# Patient Record
Sex: Male | Born: 1945 | Hispanic: No | Marital: Married | Smoking: Never smoker
Health system: Southern US, Community
[De-identification: ages and names within clinical notes are randomized; demographics above are authoritative.]

## PROBLEM LIST (undated history)

## (undated) DIAGNOSIS — E7800 Pure hypercholesterolemia, unspecified: Secondary | ICD-10-CM

## (undated) DIAGNOSIS — I10 Essential (primary) hypertension: Secondary | ICD-10-CM

## (undated) DIAGNOSIS — M1990 Unspecified osteoarthritis, unspecified site: Secondary | ICD-10-CM

## (undated) HISTORY — PX: COLONOSCOPY: SHX174

## (undated) HISTORY — PX: KNEE ARTHROSCOPY: SUR90

---

## 2016-09-12 ENCOUNTER — Other Ambulatory Visit: Payer: Self-pay | Admitting: Family Medicine

## 2016-09-12 DIAGNOSIS — R9389 Abnormal findings on diagnostic imaging of other specified body structures: Secondary | ICD-10-CM

## 2016-09-17 ENCOUNTER — Ambulatory Visit
Admission: RE | Admit: 2016-09-17 | Discharge: 2016-09-17 | Disposition: A | Discharge status: Home / Self Care | Payer: BLUE CROSS/BLUE SHIELD | Source: Ambulatory Visit | Attending: Family Medicine | Admitting: Family Medicine

## 2016-09-17 DIAGNOSIS — R9389 Abnormal findings on diagnostic imaging of other specified body structures: Secondary | ICD-10-CM

## 2017-10-01 HISTORY — PX: OTHER SURGICAL HISTORY: SHX169

## 2018-07-30 ENCOUNTER — Ambulatory Visit (INDEPENDENT_AMBULATORY_CARE_PROVIDER_SITE_OTHER): Payer: Self-pay

## 2018-07-30 ENCOUNTER — Encounter (INDEPENDENT_AMBULATORY_CARE_PROVIDER_SITE_OTHER): Payer: Self-pay | Admitting: Orthopaedic Surgery

## 2018-07-30 ENCOUNTER — Ambulatory Visit (INDEPENDENT_AMBULATORY_CARE_PROVIDER_SITE_OTHER): Payer: BLUE CROSS/BLUE SHIELD | Admitting: Orthopaedic Surgery

## 2018-07-30 DIAGNOSIS — G8929 Other chronic pain: Secondary | ICD-10-CM | POA: Diagnosis not present

## 2018-07-30 DIAGNOSIS — M1711 Unilateral primary osteoarthritis, right knee: Secondary | ICD-10-CM | POA: Diagnosis not present

## 2018-07-30 DIAGNOSIS — M25561 Pain in right knee: Secondary | ICD-10-CM

## 2018-07-30 NOTE — Progress Notes (Signed)
Office Visit Note   Patient: Ralph Flores           Date of Birth: 1945/10/14           MRN: [id number] Visit Date: 07/30/2018              Requested by: No referring provider defined for this encounter. PCP: No primary care provider on file.   Assessment & Plan: Visit Diagnoses:  1. Chronic pain of right knee   2. Unilateral primary osteoarthritis, right knee     Plan: The patient definitely has end-stage arthritis of his right knee.  I showed him a knee model and went over his x-rays in detail.  Using his phone we were able to face time with his daughter and explained in detail what the surgery involves.  We talked about his intraoperative and postoperative course.  Risk and benefits were discussed in detail.  He would like to have the surgery scheduled toward the end of the year.  All question concerns were answered and addressed.  We will work on getting this scheduled.  Follow-Up Instructions: Return for 2 weeks post-op.   Orders:  Orders Placed This Encounter  Procedures  . XR Knee 1-2 Views Right   No orders of the defined types were placed in this encounter.     Procedures: No procedures performed   Clinical Data: No additional findings.   Subjective: Chief Complaint  Patient presents with  . Right Knee - Pain  Patient is a very pleasant 72 year old gentleman who is been referred to me for evaluation treatment of severe arthritis involving his right knee.  He has been established patient with another group in town.  They have already performed all the conservative treatment of his knee for a long period time including aspirations, steroid injections, anti-inflammatory and gel shots.  He is at the point where this is not helped.  He has known severe end-stage arthritis of his right knee.  His daughter wanted him to come to me for knee replacement surgery.  His pain is daily and at this point is detrimentally affected his activity living, his quality of life and his  mobility.  Is been getting worse for over 3 years now.  He is otherwise someone who only has high blood pressure but is under good control.  He is not a diabetic.  HPI  Review of Systems He currently denies any headache, chest pain, shortness of breath, fever, chills, nausea, vomiting  Objective: Vital Signs: There were no vitals taken for this visit.  Physical Exam He is alert and oriented on 3 and in no acute distress Ortho Exam Examination of his right knee shows significant varus malalignment.  His range of motion is full and there is a moderate effusion.  The knee is ligamentously stable. Specialty Comments:  No specialty comments available.  Imaging: Xr Knee 1-2 Views Right  Result Date: 07/30/2018 2 views of the right knee show significant varus malalignment with complete loss of medial joint space.  There is severe patellofemoral arthritic changes as well.  There is particular osteophytes in all 3 compartments.    PMFS History: Patient Active Problem List   Diagnosis Date Noted  . Unilateral primary osteoarthritis, right knee 07/30/2018   History reviewed. No pertinent past medical history.  History reviewed. No pertinent family history.  History reviewed. No pertinent surgical history. Social History   Occupational History  . Not on file  Tobacco Use  . Smoking status: Not on  file  Substance and Sexual Activity  . Alcohol use: Not on file  . Drug use: Not on file  . Sexual activity: Not on file

## 2018-09-16 ENCOUNTER — Other Ambulatory Visit (INDEPENDENT_AMBULATORY_CARE_PROVIDER_SITE_OTHER): Payer: Self-pay | Admitting: Physician Assistant

## 2018-09-16 NOTE — Progress Notes (Signed)
EKG 11-08-2016 , STRESS TEST 11-12-16, OFFICE VISIT WITH DR. Jeanella CaraJ. GANJI, CXR 2017 ON CHART FROM [facility]

## 2018-09-16 NOTE — Patient Instructions (Addendum)
Ralph Flores  09/16/2018   Your procedure is scheduled on: 09-26-18   Report [facility] Main  Entr[facility]    Report to admitting at 7:15AM    Call this number if you have problems the morning of surgery [phone]     Remember: Do not eat food or drink liquids :After Midnight. BRUSH YOUR TEETH MORNING OF SURGERY AND RINSE YOUR MOUTH OUT, NO CHEWING GUM CANDY OR MINTS.      Take these medicines the morning of surgery with A SIP OF WATER:  Amlodipine                                  You may not have any metal on your body including hair pins and              piercings  Do not wear jewelry, make-up, lotions, powders or perfumes, deodorant                       Men may shave face and neck.   Do not bring valuables to the hospital. [facility] IS NOT             RESPONSIBLE   FOR VALUABLES.  Contacts, dentures or bridgework may not be worn into surgery.  Leave suitcase in the car. After surgery it may be brought to your room.                   Please read over the following fact sheets you were given: _____________________________________________________________________             [facility] - Preparing for Surgery Before surgery, you can play an important role.  Because skin is not sterile, your skin needs to be as free of germs as possible.  You can reduce the number of germs on your skin by washing with CHG (chlorahexidine gluconate) soap before surgery.  CHG is an antiseptic cleaner which kills germs and bonds with the skin to continue killing germs even after washing. Please DO NOT use if you have an allergy to CHG or antibacterial soaps.  If your skin becomes reddened/irritated stop using the CHG and inform your nurse when you arrive at Short Stay. Do not shave (including legs and underarms) for at least 48 hours prior to the first CHG shower.  You may shave your face/neck. Please follow these instructions carefully:  1.  Shower with CHG  Soap the night before surgery and the  morning of Surgery.  2.  If you choose to wash your hair, wash your hair first as usual with your  normal  shampoo.  3.  After you shampoo, rinse your hair and body thoroughly to remove the  shampoo.                           4.  Use CHG as you would any other liquid soap.  You can apply chg directly  to the skin and wash                       Gently with a scrungie or clean washcloth.  5.  Apply the CHG Soap to your body ONLY FROM THE NECK DOWN.   Do not use on face/ open  Wound or open sores. Avoid contact with eyes, ears mouth and genitals (private parts).                       Wash face,  Genitals (private parts) with your normal soap.             6.  Wash thoroughly, paying special attention to the area where your surgery  will be performed.  7.  Thoroughly rinse your body with warm water from the neck down.  8.  DO NOT shower/wash with your normal soap after using and rinsing off  the CHG Soap.                9.  Pat yourself dry with a clean towel.            10.  Wear clean pajamas.            11.  Place clean sheets on your bed the night of your first shower and do not  sleep with pets. Day of Surgery : Do not apply any lotions/deodorants the morning of surgery.  Please wear clean clothes to the hospital/surgery center.  FAILURE TO FOLLOW THESE INSTRUCTIONS MAY RESULT IN THE CANCELLATION OF YOUR SURGERY PATIENT SIGNATURE_________________________________  NURSE SIGNATURE__________________________________  ________________________________________________________________________   Ralph Flores  An incentive spirometer is a tool that can help keep your lungs clear and active. This tool measures how well you are filling your lungs with each breath. Taking long deep breaths may help reverse or decrease the chance of developing breathing (pulmonary) problems (especially infection) following:  A long period of time  when you are unable to move or be active. BEFORE THE PROCEDURE   If the spirometer includes an indicator to show your best effort, your nurse or respiratory therapist will set it to a desired goal.  If possible, sit up straight or lean slightly forward. Try not to slouch.  Hold the incentive spirometer in an upright position. INSTRUCTIONS FOR USE  1. Sit on the edge of your bed if possible, or sit up as far as you can in bed or on a chair. 2. Hold the incentive spirometer in an upright position. 3. Breathe out normally. 4. Place the mouthpiece in your mouth and seal your lips tightly around it. 5. Breathe in slowly and as deeply as possible, raising the piston or the ball toward the top of the column. 6. Hold your breath for 3-5 seconds or for as long as possible. Allow the piston or ball to fall to the bottom of the column. 7. Remove the mouthpiece from your mouth and breathe out normally. 8. Rest for a few seconds and repeat Steps 1 through 7 at least 10 times every 1-2 hours when you are awake. Take your time and take a few normal breaths between deep breaths. 9. The spirometer may include an indicator to show your best effort. Use the indicator as a goal to work toward during each repetition. 10. After each set of 10 deep breaths, practice coughing to be sure your lungs are clear. If you have an incision (the cut made at the time of surgery), support your incision when coughing by placing a pillow or rolled up towels firmly against it. Once you are able to get out of bed, walk around indoors and cough well. You may stop using the incentive spirometer when instructed by your caregiver.  RISKS AND COMPLICATIONS  Take your time so you do not get  dizzy or light-headed.  If you are in pain, you may need to take or ask for pain medication before doing incentive spirometry. It is harder to take a deep breath if you are having pain. AFTER USE  Rest and breathe slowly and easily.  It can be  helpful to keep track of a log of your progress. Your caregiver can provide you with a simple table to help with this. If you are using the spirometer at home, follow these instructions: [place] IF:   You are having difficultly using the spirometer.  You have trouble using the spirometer as often as instructed.  Your pain medication is not giving enough relief while using the spirometer.  You develop fever of 100.5 F (38.1 C) or higher. SEEK IMMEDIATE MEDICAL CARE IF:   You cough up bloody sputum that had not been present before.  You develop fever of 102 F (38.9 C) or greater.  You develop worsening pain at or near the incision site. MAKE SURE YOU:   Understand these instructions.  Will watch your condition.  Will get help right away if you are not doing well or get worse. Document Released: 01/28/2007 Document Revised: 12/10/2011 Document Reviewed: 03/31/2007 [facility] Patient Information 2014 [place], [place].   ________________________________________________________________________

## 2018-09-17 ENCOUNTER — Encounter (HOSPITAL_COMMUNITY): Payer: Self-pay

## 2018-09-17 ENCOUNTER — Encounter (HOSPITAL_COMMUNITY)
Admission: RE | Admit: 2018-09-17 | Discharge: 2018-09-17 | Disposition: A | Discharge status: Home / Self Care | Payer: BLUE CROSS/BLUE SHIELD | Source: Ambulatory Visit | Attending: Orthopaedic Surgery | Admitting: Orthopaedic Surgery

## 2018-09-17 ENCOUNTER — Other Ambulatory Visit: Payer: Self-pay

## 2018-09-17 DIAGNOSIS — Z01812 Encounter for preprocedural laboratory examination: Secondary | ICD-10-CM | POA: Diagnosis not present

## 2018-09-17 HISTORY — DX: Essential (primary) hypertension: I10

## 2018-09-17 HISTORY — DX: Pure hypercholesterolemia, unspecified: E78.00

## 2018-09-17 HISTORY — DX: Unspecified osteoarthritis, unspecified site: M19.90

## 2018-09-17 LAB — CBC
HCT: 42.5 % (ref 39.0–52.0)
Hemoglobin: 14 g/dL (ref 13.0–17.0)
MCH: 29.5 pg (ref 26.0–34.0)
MCHC: 32.9 g/dL (ref 30.0–36.0)
MCV: 89.7 fL (ref 80.0–100.0)
Platelets: 279 10*3/uL (ref 150–400)
RBC: 4.74 MIL/uL (ref 4.22–5.81)
RDW: 13.2 % (ref 11.5–15.5)
WBC: 9.9 10*3/uL (ref 4.0–10.5)
nRBC: 0 % (ref 0.0–0.2)

## 2018-09-17 LAB — BASIC METABOLIC PANEL
Anion gap: 6 (ref 5–15)
BUN: 16 mg/dL (ref 8–23)
CO2: 29 mmol/L (ref 22–32)
Calcium: 9.2 mg/dL (ref 8.9–10.3)
Chloride: 103 mmol/L (ref 98–111)
Creatinine, Ser: 0.83 mg/dL (ref 0.61–1.24)
GFR calc Af Amer: >60 mL/min (ref >60)
GFR calc non Af Amer: >60 mL/min (ref >60)
Glucose, Bld: 95 mg/dL (ref 70–99)
Potassium: 3.7 mmol/L (ref 3.5–5.1)
Sodium: 138 mmol/L (ref 135–145)

## 2018-09-17 LAB — SURGICAL PCR SCREEN
MRSA, PCR: NEGATIVE
Staphylococcus aureus: POSITIVE — AB

## 2018-09-18 NOTE — Progress Notes (Signed)
Final EKG dated 09-18-2018 in epic. 

## 2018-09-19 ENCOUNTER — Telehealth (INDEPENDENT_AMBULATORY_CARE_PROVIDER_SITE_OTHER): Payer: Self-pay | Admitting: Orthopaedic Surgery

## 2018-09-19 MED ORDER — MUPIROCIN 2 % EX OINT: TOPICAL_OINTMENT | CUTANEOUS | 0 refills | Status: DC

## 2018-09-19 NOTE — Telephone Encounter (Signed)
I called and spoke with patient's daughter and advised.  I gave her instructions to use per Dr. Magnus IvanBlackman.

## 2018-09-19 NOTE — Telephone Encounter (Signed)
Please advise 

## 2018-09-19 NOTE — Telephone Encounter (Signed)
It is very common to test positive for MRSA.  Short stay should have given them better directions.  He needs Mupirocin ointment.  This is to be placed in each nostril (a small q-tip amount) in each nostril daily for 5 days.

## 2018-09-19 NOTE — Telephone Encounter (Signed)
Patient's daughter called stating that the [facility] called the patient to let them know that he tested positive for MRSA and was advised to call the office to get an RX for an antibiotic cream.  CB#[phone].  Thank you.

## 2018-09-26 ENCOUNTER — Inpatient Hospital Stay (HOSPITAL_COMMUNITY): Payer: BLUE CROSS/BLUE SHIELD | Admitting: Anesthesiology

## 2018-09-26 ENCOUNTER — Inpatient Hospital Stay (HOSPITAL_COMMUNITY): Payer: BLUE CROSS/BLUE SHIELD

## 2018-09-26 ENCOUNTER — Inpatient Hospital Stay (HOSPITAL_COMMUNITY)
Admission: RE | Admit: 2018-09-26 | Discharge: 2018-09-28 | DRG: 470 | Disposition: A | Discharge status: Home Health | Payer: BLUE CROSS/BLUE SHIELD | Attending: Orthopaedic Surgery | Admitting: Orthopaedic Surgery

## 2018-09-26 ENCOUNTER — Encounter (HOSPITAL_COMMUNITY)
Admission: RE | Disposition: A | Discharge status: Home Health | Payer: Self-pay | Source: Home / Self Care | Attending: Orthopaedic Surgery

## 2018-09-26 ENCOUNTER — Encounter (HOSPITAL_COMMUNITY): Payer: Self-pay | Admitting: *Deleted

## 2018-09-26 ENCOUNTER — Other Ambulatory Visit: Payer: Self-pay

## 2018-09-26 DIAGNOSIS — D62 Acute posthemorrhagic anemia: Secondary | ICD-10-CM | POA: Diagnosis not present

## 2018-09-26 DIAGNOSIS — I952 Hypotension due to drugs: Secondary | ICD-10-CM | POA: Diagnosis not present

## 2018-09-26 DIAGNOSIS — E7800 Pure hypercholesterolemia, unspecified: Secondary | ICD-10-CM | POA: Diagnosis present

## 2018-09-26 DIAGNOSIS — M21161 Varus deformity, not elsewhere classified, right knee: Secondary | ICD-10-CM | POA: Diagnosis present

## 2018-09-26 DIAGNOSIS — T413X5A Adverse effect of local anesthetics, initial encounter: Secondary | ICD-10-CM | POA: Diagnosis not present

## 2018-09-26 DIAGNOSIS — Y92230 Patient room in hospital as the place of occurrence of the external cause: Secondary | ICD-10-CM | POA: Diagnosis not present

## 2018-09-26 DIAGNOSIS — M1711 Unilateral primary osteoarthritis, right knee: Secondary | ICD-10-CM | POA: Diagnosis present

## 2018-09-26 DIAGNOSIS — I10 Essential (primary) hypertension: Secondary | ICD-10-CM | POA: Diagnosis present

## 2018-09-26 DIAGNOSIS — Z96651 Presence of right artificial knee joint: Secondary | ICD-10-CM

## 2018-09-26 HISTORY — PX: TOTAL KNEE ARTHROPLASTY: SHX125

## 2018-09-26 SURGERY — ARTHROPLASTY, KNEE, TOTAL
Anesthesia: Monitor Anesthesia Care | Site: Knee | Laterality: Right

## 2018-09-26 MED ORDER — FENTANYL CITRATE (PF) 100 MCG/2ML IJ SOLN
50.0000 ug | INTRAMUSCULAR | Status: AC
  Administered 2018-09-26: 50 ug via INTRAVENOUS
  Filled 2018-09-26: qty 2

## 2018-09-26 MED ORDER — TELMISARTAN-HCTZ 80-25 MG PO TABS: 1.0000 | ORAL_TABLET | Freq: Every day | ORAL | Status: DC

## 2018-09-26 MED ORDER — SODIUM CHLORIDE 0.9 % IR SOLN
Status: DC | PRN
  Administered 2018-09-26: 1000 mL

## 2018-09-26 MED ORDER — ALUM & MAG HYDROXIDE-SIMETH 200-200-20 MG/5ML PO SUSP: 30.0000 mL | ORAL | Status: DC | PRN

## 2018-09-26 MED ORDER — ONDANSETRON HCL 4 MG/2ML IJ SOLN: 4.0000 mg | Freq: Four times a day (QID) | INTRAMUSCULAR | Status: DC | PRN

## 2018-09-26 MED ORDER — METHOCARBAMOL 500 MG IVPB - SIMPLE MED
INTRAVENOUS | Status: AC
  Administered 2018-09-26: 500 mg
  Filled 2018-09-26: qty 50

## 2018-09-26 MED ORDER — OXYCODONE HCL 5 MG PO TABS
5.0000 mg | ORAL_TABLET | ORAL | Status: DC | PRN
  Administered 2018-09-26 (×2): 5 mg via ORAL
  Filled 2018-09-26 (×2): qty 1

## 2018-09-26 MED ORDER — CEFAZOLIN SODIUM-DEXTROSE 1-4 GM/50ML-% IV SOLN
1.0000 g | Freq: Four times a day (QID) | INTRAVENOUS | Status: AC
  Administered 2018-09-26 (×2): 1 g via INTRAVENOUS
  Filled 2018-09-26 (×2): qty 50

## 2018-09-26 MED ORDER — ONDANSETRON HCL 4 MG/2ML IJ SOLN
INTRAMUSCULAR | Status: DC | PRN
  Administered 2018-09-26: 4 mg via INTRAVENOUS

## 2018-09-26 MED ORDER — ACETAMINOPHEN 325 MG PO TABS: 325.0000 mg | ORAL_TABLET | Freq: Four times a day (QID) | ORAL | Status: DC | PRN

## 2018-09-26 MED ORDER — PROPOFOL 10 MG/ML IV BOLUS
INTRAVENOUS | Status: AC
  Filled 2018-09-26: qty 60

## 2018-09-26 MED ORDER — CLONIDINE HCL (ANALGESIA) 100 MCG/ML EP SOLN
EPIDURAL | Status: DC | PRN
  Administered 2018-09-26: 50 ug

## 2018-09-26 MED ORDER — ACETAMINOPHEN 500 MG PO TABS
1000.0000 mg | ORAL_TABLET | Freq: Four times a day (QID) | ORAL | Status: DC
  Administered 2018-09-26 – 2018-09-28 (×7): 1000 mg via ORAL
  Filled 2018-09-26 (×8): qty 2

## 2018-09-26 MED ORDER — LACTATED RINGERS IV SOLN
INTRAVENOUS | Status: DC
  Administered 2018-09-26 (×2): via INTRAVENOUS

## 2018-09-26 MED ORDER — PANTOPRAZOLE SODIUM 40 MG PO TBEC
40.0000 mg | DELAYED_RELEASE_TABLET | Freq: Every day | ORAL | Status: DC
  Administered 2018-09-26 – 2018-09-28 (×3): 40 mg via ORAL
  Filled 2018-09-26 (×3): qty 1

## 2018-09-26 MED ORDER — OXYCODONE HCL 5 MG PO TABS
ORAL_TABLET | ORAL | Status: AC
  Administered 2018-09-26: 5 mg via ORAL
  Filled 2018-09-26: qty 1

## 2018-09-26 MED ORDER — METHOCARBAMOL 500 MG IVPB - SIMPLE MED
500.0000 mg | Freq: Four times a day (QID) | INTRAVENOUS | Status: DC | PRN
  Filled 2018-09-26: qty 50

## 2018-09-26 MED ORDER — MENTHOL 3 MG MT LOZG: 1.0000 | LOZENGE | OROMUCOSAL | Status: DC | PRN

## 2018-09-26 MED ORDER — BUPIVACAINE-EPINEPHRINE (PF) 0.5% -1:200000 IJ SOLN
INTRAMUSCULAR | Status: DC | PRN
  Administered 2018-09-26: 20 mL via PERINEURAL

## 2018-09-26 MED ORDER — ASPIRIN EC 325 MG PO TBEC
325.0000 mg | DELAYED_RELEASE_TABLET | Freq: Two times a day (BID) | ORAL | Status: DC
  Administered 2018-09-27 – 2018-09-28 (×3): 325 mg via ORAL
  Filled 2018-09-26 (×3): qty 1

## 2018-09-26 MED ORDER — METOCLOPRAMIDE HCL 5 MG/ML IJ SOLN: 5.0000 mg | Freq: Three times a day (TID) | INTRAMUSCULAR | Status: DC | PRN

## 2018-09-26 MED ORDER — PROPOFOL 10 MG/ML IV BOLUS
INTRAVENOUS | Status: AC
  Filled 2018-09-26: qty 20

## 2018-09-26 MED ORDER — KETOROLAC TROMETHAMINE 15 MG/ML IJ SOLN
7.5000 mg | Freq: Four times a day (QID) | INTRAMUSCULAR | Status: DC
  Administered 2018-09-26 – 2018-09-28 (×7): 7.5 mg via INTRAVENOUS
  Filled 2018-09-26 (×6): qty 1

## 2018-09-26 MED ORDER — PHENYLEPHRINE HCL 10 MG/ML IJ SOLN
INTRAMUSCULAR | Status: AC
  Filled 2018-09-26: qty 1

## 2018-09-26 MED ORDER — PROPOFOL 500 MG/50ML IV EMUL
INTRAVENOUS | Status: DC | PRN
  Administered 2018-09-26: 75 ug/kg/min via INTRAVENOUS

## 2018-09-26 MED ORDER — SODIUM CHLORIDE 0.9 % IV BOLUS
500.0000 mL | Freq: Once | INTRAVENOUS | Status: AC
  Administered 2018-09-26: 500 mL via INTRAVENOUS

## 2018-09-26 MED ORDER — ATORVASTATIN CALCIUM 40 MG PO TABS
40.0000 mg | ORAL_TABLET | Freq: Every day | ORAL | Status: DC
  Administered 2018-09-26: 40 mg via ORAL
  Filled 2018-09-26 (×2): qty 1

## 2018-09-26 MED ORDER — SODIUM CHLORIDE 0.9 % IV SOLN
INTRAVENOUS | Status: DC
  Administered 2018-09-26 – 2018-09-27 (×2): via INTRAVENOUS

## 2018-09-26 MED ORDER — BUPIVACAINE IN DEXTROSE 0.75-8.25 % IT SOLN
INTRATHECAL | Status: DC | PRN
  Administered 2018-09-26: 1.6 mL via INTRATHECAL

## 2018-09-26 MED ORDER — DIPHENHYDRAMINE HCL 12.5 MG/5ML PO ELIX: 12.5000 mg | ORAL_SOLUTION | ORAL | Status: DC | PRN

## 2018-09-26 MED ORDER — MIDAZOLAM HCL 2 MG/2ML IJ SOLN
1.0000 mg | INTRAMUSCULAR | Status: AC
  Administered 2018-09-26: 1 mg via INTRAVENOUS
  Filled 2018-09-26: qty 2

## 2018-09-26 MED ORDER — TAMSULOSIN HCL 0.4 MG PO CAPS
0.4000 mg | ORAL_CAPSULE | Freq: Every day | ORAL | Status: DC
  Administered 2018-09-26: 0.4 mg via ORAL
  Filled 2018-09-26: qty 1

## 2018-09-26 MED ORDER — VITAMIN D 25 MCG (1000 UNIT) PO TABS
2000.0000 [IU] | ORAL_TABLET | Freq: Every day | ORAL | Status: DC
  Administered 2018-09-27 – 2018-09-28 (×2): 2000 [IU] via ORAL
  Filled 2018-09-26 (×2): qty 2

## 2018-09-26 MED ORDER — CHLORHEXIDINE GLUCONATE 4 % EX LIQD: 60.0000 mL | Freq: Once | CUTANEOUS | Status: DC

## 2018-09-26 MED ORDER — HYDROMORPHONE HCL 1 MG/ML IJ SOLN
0.5000 mg | INTRAMUSCULAR | Status: DC | PRN
  Administered 2018-09-26: 0.5 mg via INTRAVENOUS
  Filled 2018-09-26: qty 1

## 2018-09-26 MED ORDER — STERILE WATER FOR IRRIGATION IR SOLN
Status: AC | PRN
  Administered 2018-09-26: 2000 mL/h

## 2018-09-26 MED ORDER — POLYETHYLENE GLYCOL 3350 17 G PO PACK: 17.0000 g | PACK | Freq: Every day | ORAL | Status: DC | PRN

## 2018-09-26 MED ORDER — SODIUM CHLORIDE 0.9 % IV SOLN
INTRAVENOUS | Status: DC | PRN
  Administered 2018-09-26: 40 ug/min via INTRAVENOUS

## 2018-09-26 MED ORDER — KETOROLAC TROMETHAMINE 15 MG/ML IJ SOLN
INTRAMUSCULAR | Status: AC
  Administered 2018-09-26: 7.5 mg via INTRAVENOUS
  Filled 2018-09-26: qty 1

## 2018-09-26 MED ORDER — ONDANSETRON HCL 4 MG PO TABS: 4.0000 mg | ORAL_TABLET | Freq: Four times a day (QID) | ORAL | Status: DC | PRN

## 2018-09-26 MED ORDER — METHOCARBAMOL 500 MG PO TABS
500.0000 mg | ORAL_TABLET | Freq: Four times a day (QID) | ORAL | Status: DC | PRN
  Administered 2018-09-26 – 2018-09-28 (×4): 500 mg via ORAL
  Filled 2018-09-26 (×4): qty 1

## 2018-09-26 MED ORDER — OXYCODONE HCL 5 MG PO TABS: 10.0000 mg | ORAL_TABLET | ORAL | Status: DC | PRN

## 2018-09-26 MED ORDER — HYDROCHLOROTHIAZIDE 25 MG PO TABS
25.0000 mg | ORAL_TABLET | Freq: Every day | ORAL | Status: DC
  Administered 2018-09-28: 25 mg via ORAL
  Filled 2018-09-26: qty 1

## 2018-09-26 MED ORDER — FENTANYL CITRATE (PF) 100 MCG/2ML IJ SOLN: 25.0000 ug | INTRAMUSCULAR | Status: DC | PRN

## 2018-09-26 MED ORDER — LIP MEDEX EX OINT
TOPICAL_OINTMENT | CUTANEOUS | Status: AC
  Filled 2018-09-26: qty 7

## 2018-09-26 MED ORDER — CEFAZOLIN SODIUM-DEXTROSE 2-4 GM/100ML-% IV SOLN
2.0000 g | INTRAVENOUS | Status: AC
  Administered 2018-09-26: 2 g via INTRAVENOUS
  Filled 2018-09-26: qty 100

## 2018-09-26 MED ORDER — PHENOL 1.4 % MT LIQD
1.0000 | OROMUCOSAL | Status: DC | PRN
  Filled 2018-09-26: qty 177

## 2018-09-26 MED ORDER — ZOLPIDEM TARTRATE 5 MG PO TABS
5.0000 mg | ORAL_TABLET | Freq: Every evening | ORAL | Status: DC | PRN
  Administered 2018-09-26: 5 mg via ORAL
  Filled 2018-09-26: qty 1

## 2018-09-26 MED ORDER — DOCUSATE SODIUM 100 MG PO CAPS
100.0000 mg | ORAL_CAPSULE | Freq: Two times a day (BID) | ORAL | Status: DC
  Administered 2018-09-26 – 2018-09-28 (×4): 100 mg via ORAL
  Filled 2018-09-26 (×4): qty 1

## 2018-09-26 MED ORDER — TRANEXAMIC ACID-NACL 1000-0.7 MG/100ML-% IV SOLN
1000.0000 mg | INTRAVENOUS | Status: AC
  Administered 2018-09-26: 1000 mg via INTRAVENOUS
  Filled 2018-09-26: qty 100

## 2018-09-26 MED ORDER — AMLODIPINE BESYLATE 2.5 MG PO TABS
2.5000 mg | ORAL_TABLET | Freq: Every day | ORAL | Status: DC
  Administered 2018-09-27 – 2018-09-28 (×2): 2.5 mg via ORAL
  Filled 2018-09-26 (×2): qty 1

## 2018-09-26 MED ORDER — ONDANSETRON HCL 4 MG/2ML IJ SOLN
INTRAMUSCULAR | Status: AC
  Filled 2018-09-26: qty 2

## 2018-09-26 MED ORDER — TELMISARTAN 40 MG PO TABS
80.0000 mg | ORAL_TABLET | Freq: Every day | ORAL | Status: DC
  Administered 2018-09-28: 80 mg via ORAL
  Filled 2018-09-26 (×2): qty 2

## 2018-09-26 MED ORDER — METOCLOPRAMIDE HCL 5 MG PO TABS: 5.0000 mg | ORAL_TABLET | Freq: Three times a day (TID) | ORAL | Status: DC | PRN

## 2018-09-26 SURGICAL SUPPLY — 56 items
BAG ZIPLOCK 12X15 (MISCELLANEOUS) IMPLANT
BANDAGE ACE 6X5 VEL STRL LF (GAUZE/BANDAGES/DRESSINGS) ×2 IMPLANT
BASEPLATE TIBIAL TRIATHALON 3 (Plate) ×2 IMPLANT
BEARIN INSERT TIBIAL SZ 3 11 (Insert) ×2 IMPLANT
BEARING INSERT TIBIAL SZ 3 11 (Insert) ×1 IMPLANT
BENZOIN TINCTURE PRP APPL 2/3 (GAUZE/BANDAGES/DRESSINGS) ×2 IMPLANT
BLADE SAG 18X100X1.27 (BLADE) IMPLANT
BLADE SURG SZ10 CARB STEEL (BLADE) ×4 IMPLANT
BNDG ELASTIC 6X10 VLCR STRL LF (GAUZE/BANDAGES/DRESSINGS) ×2 IMPLANT
BOWL SMART MIX CTS (DISPOSABLE) IMPLANT
CEMENT BONE SIMPLEX SPEEDSET (Cement) ×4 IMPLANT
COVER SURGICAL LIGHT HANDLE (MISCELLANEOUS) ×2 IMPLANT
COVER WAND RF STERILE (DRAPES) IMPLANT
CUFF TOURN SGL QUICK 34 (TOURNIQUET CUFF) ×1
CUFF TRNQT CYL 34X4X40X1 (TOURNIQUET CUFF) ×1 IMPLANT
DECANTER SPIKE VIAL GLASS SM (MISCELLANEOUS) IMPLANT
DRAPE U-SHAPE 47X51 STRL (DRAPES) ×2 IMPLANT
DRSG PAD ABDOMINAL 8X10 ST (GAUZE/BANDAGES/DRESSINGS) ×2 IMPLANT
DURAPREP 26ML APPLICATOR (WOUND CARE) ×2 IMPLANT
ELECT REM PT RETURN 15FT ADLT (MISCELLANEOUS) ×2 IMPLANT
FEMORAL PEG DISTAL FIXATION (Orthopedic Implant) ×2 IMPLANT
FEMORAL POST STABILIZED NO 3 (Orthopedic Implant) ×2 IMPLANT
GAUZE SPONGE 4X4 12PLY STRL (GAUZE/BANDAGES/DRESSINGS) ×2 IMPLANT
GAUZE XEROFORM 1X8 LF (GAUZE/BANDAGES/DRESSINGS) IMPLANT
GLOVE BIO SURGEON STRL SZ7.5 (GLOVE) ×2 IMPLANT
GLOVE BIOGEL PI IND STRL 8 (GLOVE) ×2 IMPLANT
GLOVE BIOGEL PI INDICATOR 8 (GLOVE) ×2
GLOVE ECLIPSE 8.0 STRL XLNG CF (GLOVE) ×2 IMPLANT
GOWN STRL REUS W/TWL XL LVL3 (GOWN DISPOSABLE) ×4 IMPLANT
HANDPIECE INTERPULSE COAX TIP (DISPOSABLE) ×1
HOLDER FOLEY CATH W/STRAP (MISCELLANEOUS) IMPLANT
IMMOBILIZER KNEE 20 (SOFTGOODS) ×2
IMMOBILIZER KNEE 20 THIGH 36 (SOFTGOODS) ×1 IMPLANT
NDL SAFETY ECLIPSE 18X1.5 (NEEDLE) IMPLANT
NEEDLE HYPO 18GX1.5 SHARP (NEEDLE)
NS IRRIG 1000ML POUR BTL (IV SOLUTION) ×2 IMPLANT
PACK TOTAL KNEE CUSTOM (KITS) ×2 IMPLANT
PAD ABD 8X10 STRL (GAUZE/BANDAGES/DRESSINGS) ×2 IMPLANT
PADDING CAST COTTON 6X4 STRL (CAST SUPPLIES) ×2 IMPLANT
PATELLA TRIATHLON SZ 29 9 MM (Orthopedic Implant) ×2 IMPLANT
PROTECTOR NERVE ULNAR (MISCELLANEOUS) ×2 IMPLANT
SET HNDPC FAN SPRY TIP SCT (DISPOSABLE) ×1 IMPLANT
SET PAD KNEE POSITIONER (MISCELLANEOUS) ×2 IMPLANT
STAPLER VISISTAT 35W (STAPLE) IMPLANT
STRIP CLOSURE SKIN 1/2X4 (GAUZE/BANDAGES/DRESSINGS) ×2 IMPLANT
SUT MNCRL AB 4-0 PS2 18 (SUTURE) ×2 IMPLANT
SUT VIC AB 0 CT1 27 (SUTURE) ×1
SUT VIC AB 0 CT1 27XBRD ANTBC (SUTURE) ×1 IMPLANT
SUT VIC AB 1 CT1 36 (SUTURE) ×4 IMPLANT
SUT VIC AB 2-0 CT1 27 (SUTURE) ×2
SUT VIC AB 2-0 CT1 TAPERPNT 27 (SUTURE) ×2 IMPLANT
SYR 3ML LL SCALE MARK (SYRINGE) IMPLANT
TRAY FOLEY MTR SLVR 16FR STAT (SET/KITS/TRAYS/PACK) ×2 IMPLANT
WATER STERILE IRR 1000ML POUR (IV SOLUTION) ×2 IMPLANT
WRAP KNEE MAXI GEL POST OP (GAUZE/BANDAGES/DRESSINGS) ×2 IMPLANT
YANKAUER SUCT BULB TIP 10FT TU (MISCELLANEOUS) ×2 IMPLANT

## 2018-09-26 NOTE — Progress Notes (Signed)
AssistedDr. Rob Fitzgerald with right, ultrasound guided, adductor canal block. Side rails up, monitors on throughout procedure. See vital signs in flow sheet. Tolerated Procedure well.  

## 2018-09-26 NOTE — Anesthesia Procedure Notes (Signed)
Spinal  Patient location during procedure: OR Start time: 09/26/2018 10:42 AM End time: 09/26/2018 10:46 AM Reason for block: at surgeon's request Staffing Resident/CRNA: Anne Fu, CRNA Performed: resident/CRNA  Preanesthetic Checklist Completed: patient identified, site marked, surgical consent, pre-op evaluation, timeout performed, IV checked, risks and benefits discussed and monitors and equipment checked Spinal Block Patient position: sitting Prep: DuraPrep Patient monitoring: heart rate, continuous pulse ox and blood pressure Approach: right paramedian Location: L2-3 Injection technique: single-shot Needle Needle type: Pencan  Needle gauge: 24 G Needle length: 9 cm Assessment Sensory level: T6 Additional Notes  Functioning IV was confirmed and monitors were applied. Expiration date of kit checked and confirmed. Sterile prep and drape, including hand hygiene and sterile gloves were used. The patient was positioned and the spine was prepped. The skin was anesthetized with lidocaine.  Free flow of clear CSF was obtained prior to injecting local anesthetic into the CSF X 1 attempt.  The spinal needle aspirated freely following injection.  The needle was carefully withdrawn. Patient tolerated procedure well, without complications. Loss of motor and sensory on exam post injection.

## 2018-09-26 NOTE — Plan of Care (Signed)
Plan of care reviewed and discussed with the patient. 

## 2018-09-26 NOTE — Anesthesia Preprocedure Evaluation (Addendum)
Anesthesia Evaluation  Patient identified by MRN, date of birth, ID band Patient awake    Reviewed: Allergy & Precautions, NPO status , Patient's Chart, lab work & pertinent test results  Airway Mallampati: II  TM Distance: >3 FB Neck ROM: Full    Dental  (+) Dental Advisory Given   Pulmonary neg pulmonary ROS,    breath sounds clear to auscultation       Cardiovascular hypertension, Pt. on medications  Rhythm:Regular Rate:Normal     Neuro/Psych negative neurological ROS     GI/Hepatic negative GI ROS, Neg liver ROS,   Endo/Other  negative endocrine ROS  Renal/GU negative Renal ROS     Musculoskeletal  (+) Arthritis ,   Abdominal   Peds  Hematology negative hematology ROS (+)   Anesthesia Other Findings   Reproductive/Obstetrics                             Lab Results  Component Value Date   WBC 9.9 09/17/2018   HGB 14.0 09/17/2018   HCT 42.5 09/17/2018   MCV 89.7 09/17/2018   PLT 279 09/17/2018   Lab Results  Component Value Date   CREATININE 0.83 09/17/2018   BUN 16 09/17/2018   NA 138 09/17/2018   K 3.7 09/17/2018   CL 103 09/17/2018   CO2 29 09/17/2018    Anesthesia Physical Anesthesia Plan  ASA: II  Anesthesia Plan: MAC and Spinal   Post-op Pain Management:  Regional for Post-op pain   Induction: Intravenous  PONV Risk Score and Plan: 1 and Ondansetron, Propofol infusion and Treatment may vary due to age or medical condition  Airway Management Planned: Natural Airway and Simple Face Mask  Additional Equipment:   Intra-op Plan:   Post-operative Plan:   Informed Consent: I have reviewed the patients History and Physical, chart, labs and discussed the procedure including the risks, benefits and alternatives for the proposed anesthesia with the patient or authorized representative who has indicated his/her understanding and acceptance.     Plan Discussed  with: CRNA  Anesthesia Plan Comments:        Anesthesia Quick Evaluation

## 2018-09-26 NOTE — Anesthesia Postprocedure Evaluation (Signed)
Anesthesia Post Note  Patient: Ralph Flores  Procedure(s) Performed: RIGHT TOTAL KNEE ARTHROPLASTY (Right Knee)     Patient location during evaluation: PACU Anesthesia Type: Spinal Level of consciousness: awake and alert Pain management: pain level controlled Vital Signs Assessment: post-procedure vital signs reviewed and stable Respiratory status: spontaneous breathing and respiratory function stable Cardiovascular status: blood pressure returned to baseline and stable Postop Assessment: spinal receding Anesthetic complications: no    Last Vitals:  Vitals:   09/26/18 1345 09/26/18 1400  BP: 99/75 98/74  Pulse: 87 86  Resp: 18 19  Temp:  36.6 C  SpO2: 97% 100%    Last Pain:  Vitals:   09/26/18 1400  TempSrc:   PainSc: 0-No pain                 Kennieth RadFitzgerald, Shloima Clinch E

## 2018-09-26 NOTE — Op Note (Signed)
NAME: Ralph Flores, Ralph Flores MEDICAL RECORD ZO:[id number] ACCOUNT [id number] DATE OF BIRTH:1945-11-22 FACILITY: [facility] LOCATION: [facility] PHYSICIAN:Noreene Boreman Aretha ParrotY. Kapri Nero, MD  OPERATIVE REPORT  DATE OF PROCEDURE:  09/26/2018  PREOPERATIVE DIAGNOSIS:  Primary osteoarthritis and degenerative joint disease, right knee with significant varus malalignment.  POSTOPERATIVE DIAGNOSIS:  Primary osteoarthritis and degenerative joint disease, right knee with significant varus malalignment.  PROCEDURE:  Right total knee arthroplasty.  IMPLANTS:  Stryker Triathlon cemented knee system with size 3 tibia, size 3 femur, size 11 fixed bearing polyethylene insert, size 29 patellar button.  SURGEON:  Vanita PandaChristopher Y.  Magnus IvanBlackman, MD  ASSISTANT:   _____ RNFA.  ANESTHESIA: 1.  Right lower extremity adductor canal block. 2.  Spinal.  TOURNIQUET TIME:  Under 1 hour.  ESTIMATED BLOOD LOSS:  100 mL.  ANTIBIOTICS:  Two grams IV Ancef.  COMPLICATIONS:  None.  INDICATIONS:  The patient is a very pleasant 72 year old gentleman with debilitating arthritis involving his right knee.  He has significant varus malalignment of that knee as well.  He does have some varus malalignment of the left knee, but it is much  more significant on the right knee.  His right knee x-rays show significant arthritis throughout the knee.  His pain is daily and has detrimentally affected his mobility, his activities of daily living, his quality of life to the point he does wish to  proceed with total knee arthroplasty and we have recommended this to him.  He understands fully the risk of acute blood loss anemia, nerve or vessel injury, fracture, infection, DVT, and implant failure.  He understands our goals are to decrease pain,  improve mobility and overall improve quality of life.  DESCRIPTION OF PROCEDURE:  After informed consent was obtained and appropriate right knee was marked, an adductor canal block was obtained in the holding  room.  He was then brought to the operating room and placed supine on the operating table.  Spinal  anesthesia was then obtained after he was sat up.  He was then laid in the supine position again.  A Foley catheter was placed.  A nonsterile tourniquet was placed around his upper right thigh and his right thigh, knee, leg, ankle and foot were prepped  and draped with DuraPrep and sterile drapes in a sterile stockinette.  Time-out was called and he was identified as the correct patient, correct right knee.  We then used an Esmarch to wrap that leg and tourniquet was inflated to 300 mm of pressure.  I  then made a midline incision over the patella and carried this proximally and distally.  We dissected down the knee joint carried out a medial  parapatellar arthrotomy finding a significant large joint effusion and significant arthritis throughout his  knee, especially the medial compartment where there is complete denuding of the cartilage.  With the knee in a flexed position, we removed remnants of ACL, PCL, medial and lateral meniscus.  We set our extramedullary cutting guide for taking 9 mm off the  high side, correcting for varus and valgus and neutral slope.  We made this cut without difficulty.  We then used an intramedullary drill for making our distal femoral cut setting this for an 8 mm distal femoral cut.  This was for an intramedullary  guide.  We set the rotation and only 30 degrees externally rotated for significant varus malalignment.  We made our distal femoral cut without difficulty and brought the knee back down to full extension with a 9 mm extension block had  achieved full  extension and we removed further debris from the back of the knee.  We then flexed the knee again used epicondylar axis and a femoral sizing guide to choose our femur.  We chose a size 3 femur.  Based on that, we placed a 4-in-1 cutting block on a size 3  femur, made our anterior and posterior cuts followed by our chamfer  cuts.  We then made our femoral box cut.  We then went back to the tibia and selected our size 3 tibial tray setting the rotation off the tibial tubercle and the femur.  We made this  keel punch without difficulty.  We then trialed the size 3 tibia, followed by the 3 femur and then we went with the 11 mm fixed bearing trial polyethylene insert and we were pleased with the range of motion and stability of the knee with that.  We then  made our patellar cut and drilled 3 holes for size 29 patellar button.  We then removed all instrumentation from the knee and irrigated the knee with normal saline solution using pulsatile lavage.  We mixed our cement and then cemented our real Stryker  Triathlon tibial component, size 3, followed by real size 3 right femur.  We placed our real 11 mm fixed bearing polyethylene insert and cemented our patellar button.  We removed cement debris from the knee and then held the knee in extended position.   Once the cement hardened, we removed additional cement debris.  I put the knee through a range of motion and it felt stable.  We then let the tourniquet down.  Hemostasis obtained with electrocautery.  We then closed the arthrotomy with interrupted #1  Vicryl suture, followed by 0 Vicryl in the deep tissue, 2-0 Vicryl subcutaneous tissue, 4-0 Monocryl subcuticular stitch and Steri-Strips on the skin.  A well-padded sterile was applied.    He was taken off the operating table and taken to recovery room in stable condition.    All final counts were correct.  There were no complications noted.  AN/NUANCE  D:09/26/2018 T:09/26/2018 JOB:[id number]

## 2018-09-26 NOTE — Anesthesia Procedure Notes (Signed)
Anesthesia Regional Block: Adductor canal block   Pre-Anesthetic Checklist: ,, timeout performed, Correct Patient, Correct Site, Correct Laterality, Correct Procedure, Correct Position, site marked, Risks and benefits discussed,  Surgical consent,  Pre-op evaluation,  At surgeon's request and post-op pain management  Laterality: Right  Prep: chloraprep       Needles:  Injection technique: Single-shot  Needle Type: Echogenic Needle     Needle Length: 9cm  Needle Gauge: 21     Additional Needles:   Procedures:,,,, ultrasound used (permanent image in chart),,,,  Narrative:  Start time: 09/26/2018 9:22 AM End time: 09/26/2018 9:28 AM Injection made incrementally with aspirations every 5 mL.  Performed by: Personally  Anesthesiologist: Marcene DuosFitzgerald, Blaire Hodsdon, MD

## 2018-09-26 NOTE — H&P (Signed)
TOTAL KNEE ADMISSION H&P  Patient is being admitted for right total knee arthroplasty.  Subjective:  Chief Complaint:right knee pain.  HPI: Ralph Flores, 72 y.o. male, has a history of pain and functional disability in the right knee due to arthritis and has failed non-surgical conservative treatments for greater than 12 weeks to includeNSAID's and/or analgesics, corticosteriod injections, flexibility and strengthening excercises, use of assistive devices and activity modification.  Onset of symptoms was gradual, starting 3 years ago with gradually worsening course since that time. The patient noted no past surgery on the right knee(s).  Patient currently rates pain in the right knee(s) at 10 out of 10 with activity. Patient has night pain, worsening of pain with activity and weight bearing, pain that interferes with activities of daily living, pain with passive range of motion, crepitus and joint swelling.  Patient has evidence of subchondral sclerosis, periarticular osteophytes and joint space narrowing by imaging studies. There is no active infection.  Patient Active Problem List   Diagnosis Date Noted  . Unilateral primary osteoarthritis, right knee 07/30/2018   Past Medical History:  Diagnosis Date  . Arthritis   . High cholesterol   . HTN (hypertension)     Past Surgical History:  Procedure Laterality Date  . cataract  Bilateral 2019  . COLONOSCOPY    . KNEE ARTHROSCOPY Left 5 years   done by dr Turner Danielsrowan     No current facility-administered medications for this encounter.    Not on File  Social History   Tobacco Use  . Smoking status: Never Smoker  . Smokeless tobacco: Never Used  Substance Use Topics  . Alcohol use: Not on file    No family history on file.   Review of Systems  Musculoskeletal: Positive for joint pain.  All other systems reviewed and are negative.   Objective:  Physical Exam  Constitutional: He is oriented to person, place, and time. He  appears well-developed and well-nourished.  HENT:  Head: Normocephalic and atraumatic.  Eyes: Pupils are equal, round, and reactive to light. EOM are normal.  Neck: Normal range of motion.  Cardiovascular: Normal rate.  Respiratory: Effort normal.  GI: Soft.  Musculoskeletal:     Right knee: He exhibits decreased range of motion, effusion, abnormal alignment, bony tenderness and abnormal meniscus. Tenderness found. Medial joint line and lateral joint line tenderness noted.  Neurological: He is alert and oriented to person, place, and time.  Skin: Skin is warm and dry.  Psychiatric: He has a normal mood and affect.    Vital signs in last 24 hours: Temp:  [98.6 F (37 C)] 98.6 F (37 C) (12/27 0712) Pulse Rate:  [83] 83 (12/27 0712) Resp:  [18] 18 (12/27 0712) BP: (133)/(86) 133/86 (12/27 0712) SpO2:  [100 %] 100 % (12/27 0712)  Labs:   Estimated body mass index is 25.61 kg/m as calculated from the following:   Height as of 09/17/18: 5\' 2"  (1.575 m).   Weight as of 09/17/18: 63.5 kg.   Imaging Review Plain radiographs demonstrate severe degenerative joint disease of the right knee(s). The overall alignment ismild varus. The bone quality appears to be good for age and reported activity level.   Preoperative templating of the joint replacement has been completed, documented, and submitted to the Operating Room personnel in order to optimize intra-operative equipment management.   Anticipated LOS equal to or greater than 2 midnights due to - Age 71 and older with one or more of the following:  -  Obesity  - Expected need for hospital services (PT, OT, Nursing) required for safe  discharge  - Anticipated need for postoperative skilled nursing care or inpatient rehab  - Active co-morbidities: None OR   - Unanticipated findings during/Post Surgery: None  - Patient is a high risk of re-admission due to: None     Assessment/Plan:  End stage arthritis, right knee   The  patient history, physical examination, clinical judgment of the provider and imaging studies are consistent with end stage degenerative joint disease of the right knee(s) and total knee arthroplasty is deemed medically necessary. The treatment options including medical management, injection therapy arthroscopy and arthroplasty were discussed at length. The risks and benefits of total knee arthroplasty were presented and reviewed. The risks due to aseptic loosening, infection, stiffness, patella tracking problems, thromboembolic complications and other imponderables were discussed. The patient acknowledged the explanation, agreed to proceed with the plan and consent was signed. Patient is being admitted for inpatient treatment for surgery, pain control, PT, OT, prophylactic antibiotics, VTE prophylaxis, progressive ambulation and ADL's and discharge planning. The patient is planning to be discharged home with home health services

## 2018-09-26 NOTE — Transfer of Care (Signed)
Immediate Anesthesia Transfer of Care Note  Patient: Ralph Flores  Procedure(s) Performed: Procedure(s): RIGHT TOTAL KNEE ARTHROPLASTY (Right)  Patient Location: PACU  Anesthesia Type:Spinal  Level of Consciousness:  sedated, patient cooperative and responds to stimulation  Airway & Oxygen Therapy:Patient Spontanous Breathing and Patient connected to face mask oxgen  Post-op Assessment:  Report given to PACU RN and Post -op Vital signs reviewed and stable  Post vital signs:  Reviewed and stable  Last Vitals:  Vitals:   09/26/18 1003 09/26/18 1008  BP: 119/73 122/73  Pulse: 94 88  Resp: 20 17  Temp:    SpO2: 100% 100%    Complications: No apparent anesthesia complications

## 2018-09-26 NOTE — Brief Op Note (Signed)
09/26/2018  12:23 PM  PATIENT:  Lonia FarberAhsan Habib Goodwyn  72 y.o. male  PRE-OPERATIVE DIAGNOSIS:  osteoarthritis right knee  POST-OPERATIVE DIAGNOSIS:  osteoarthritis right knee  PROCEDURE:  Procedure(s): RIGHT TOTAL KNEE ARTHROPLASTY (Right)  SURGEON:  Surgeon(s) and Role:    Kathryne Hitch* Kiowa Hollar Y, MD - Primary  ANESTHESIA:   regional and spinal  EBL:  50 mL   COUNTS:  YES  TOURNIQUET:   Total Tourniquet Time Documented: Thigh (Right) - 56 minutes Total: Thigh (Right) - 56 minutes   DICTATION: .Other Dictation: Dictation Number [id number]  PLAN OF CARE: Admit to inpatient   PATIENT DISPOSITION:  PACU - hemodynamically stable.   Delay start of Pharmacological VTE agent (>24hrs) due to surgical blood loss or risk of bleeding: no

## 2018-09-26 NOTE — Addendum Note (Signed)
Addendum  created 09/26/18 2050 by Elyn PeersAllen, Juletta Berhe J, CRNA   Charge Capture section accepted

## 2018-09-26 NOTE — Evaluation (Signed)
Physical Therapy Evaluation Patient Details Name: Ralph Flores MRN: [id number] DOB: 02-07-1946 Today's Date: 09/26/2018   History of Present Illness  72 yo male s/p R TKR on 09/26/18. PMH includes OA, HTN, L knee arthroscopy.   Clinical Impression  Pt presents with R knee pain, decreased R knee ROM, difficulty performing bed mobility, increased time and effort to perform mobility tasks due to R knee pain, and decreased tolerance for ambulation. Pt to benefit from acute PT to address deficits. Pt ambulated 15 ft with RW with min guard assist, pt limited by feeling faint/hypotension. Pt educated on ankle pumps (20/hour) to perform this afternoon/evening to increase circulation, to pt's tolerance and limited by pain. PT to progress mobility as tolerated, and will continue to follow acutely.   BP after ambulation: 73/42, HR 60s-70s bpm  BP 5 minutes after ambulation: 90/50, HR 63 bpm      Follow Up Recommendations Follow surgeon's recommendation for DC plan and follow-up therapies;Supervision for mobility/OOB(HHPT )    Equipment Recommendations  3in1 (PT)    Recommendations for Other Services       Precautions / Restrictions Precautions Precautions: Fall Required Braces or Orthoses: Knee Immobilizer - Right Knee Immobilizer - Right: Other (comment)(Pt requested to have KI for sleeping ) Restrictions Weight Bearing Restrictions: No Other Position/Activity Restrictions: WBAT       Mobility  Bed Mobility Overal bed mobility: Needs Assistance Bed Mobility: Supine to Sit;Sit to Supine     Supine to sit: Min assist;HOB elevated Sit to supine: Min assist;HOB elevated;+2 for physical assistance   General bed mobility comments: Min assist for supine to sit for LE management, scooting to EOB. Pt with use of bedrails to sit up with trunk. Min assist +2 for sit to supine for LE management, trunk lowering, and positioning in bed with use of bed pad.   Transfers Overall transfer  level: Needs assistance Equipment used: Rolling walker (2 wheeled) Transfers: Sit to/from Stand Sit to Stand: Min guard;From elevated surface         General transfer comment: Min guard for safety. Verbal cuing for hand placement. Sit to stand x2, once from bed and once from recliner when moving back to bed.   Ambulation/Gait Ambulation/Gait assistance: Min guard;+2 safety/equipment Gait Distance (Feet): 15 Feet Assistive device: Rolling walker (2 wheeled) Gait Pattern/deviations: Step-to pattern;Decreased stance time - right;Decreased weight shift to right;Antalgic Gait velocity: decr    General Gait Details: Min guard for safety, guarding RLE to prevent buckling if needed. No right knee instability noted. After 10 ft ambulation, pt reporting dizziness. Pt refusing to sit EOB and ambulated 5 more ft to reach recliner. Upon sitting, pt given cool cloth for forehead. BP 73/42, pulse 60s-70s. Due to hypotension, PT and pt's daughter (who is an Charity fundraiserN) assisted pt back to bed and returned pt to supine positioning. After 5 minutes, pt with BP of 90/50 with HR WNL.   Stairs            Wheelchair Mobility    Modified Rankin (Stroke Patients Only)       Balance Overall balance assessment: Mild deficits observed, not formally tested                                           Pertinent Vitals/Pain Pain Assessment: 0-10 Pain Score: 5  Pain Location: R knee  Pain Descriptors / Indicators:  Aching Pain Intervention(s): Limited activity within patient's tolerance;Repositioned;Monitored during session;Ice applied;Premedicated before session    Home Living Family/patient expects to be discharged to:: Private residence Living Arrangements: Spouse/significant other Available Help at Discharge: Family;Available PRN/intermittently(Pt's wife is not able to physically assist him because of back trouble) Type of Home: House Home Access: Level entry     Home Layout: Two  level;1/2 bath on main level Home Equipment: Walker - 2 wheels      Prior Function Level of Independence: Independent               Hand Dominance   Dominant Hand: Right    Extremity/Trunk Assessment   Upper Extremity Assessment Upper Extremity Assessment: Overall WFL for tasks assessed    Lower Extremity Assessment Lower Extremity Assessment: Overall WFL for tasks assessed;RLE deficits/detail RLE Deficits / Details: suspected post-surgical weakness; able to perform ankle pumps, quad set, SLR with lift assist  RLE Sensation: decreased light touch(decreased light touch of R plantar surface of foot)    Cervical / Trunk Assessment Cervical / Trunk Assessment: Normal  Communication   Communication: No difficulties  Cognition Arousal/Alertness: Awake/alert Behavior During Therapy: WFL for tasks assessed/performed Overall Cognitive Status: Within Functional Limits for tasks assessed                                        General Comments      Exercises Total Joint Exercises Goniometric ROM: knee AROM ~10-50*, limited by pain and stiffness   Assessment/Plan    PT Assessment Patient needs continued PT services  PT Problem List Decreased strength;Pain;Decreased range of motion;Decreased activity tolerance;Decreased knowledge of use of DME;Decreased balance;Decreased mobility       PT Treatment Interventions DME instruction;Therapeutic activities;Gait training;Patient/family education;Therapeutic exercise;Balance training;Functional mobility training    PT Goals (Current goals can be found in the Care Plan section)  Acute Rehab PT Goals Patient Stated Goal: none stated  PT Goal Formulation: With patient/family Time For Goal Achievement: 10/03/18 Potential to Achieve Goals: Good    Frequency 7X/week   Barriers to discharge        Co-evaluation               AM-PAC PT "6 Clicks" Mobility  Outcome Measure Help needed turning from your  back to your side while in a flat bed without using bedrails?: A Little Help needed moving from lying on your back to sitting on the side of a flat bed without using bedrails?: A Little Help needed moving to and from a bed to a chair (including a wheelchair)?: A Little Help needed standing up from a chair using your arms (e.g., wheelchair or bedside chair)?: A Little Help needed to walk in hospital room?: A Little Help needed climbing 3-5 steps with a railing? : A Little 6 Click Score: 18    End of Session Equipment Utilized During Treatment: Gait belt Activity Tolerance: Treatment limited secondary to medical complications (Comment);Patient limited by pain(hypotension, feeling faint ) Patient left: in bed;with call bell/phone within reach;with family/visitor present(SCDs left off for shift change) Nurse Communication: Mobility status;Other (comment)(hypotension) PT Visit Diagnosis: Other abnormalities of gait and mobility (R26.89);Difficulty in walking, not elsewhere classified (R26.2)    Time: 1610-9604 PT Time Calculation (min) (ACUTE ONLY): 30 min   Charges:   PT Evaluation $PT Eval Low Complexity: 1 Low PT Treatments $Gait Training: 8-22 mins  Nicola PoliceAlexa D Noni Stonesifer, PT Acute Rehabilitation Services Pager [phone]  Office [phone]   Tyrone AppleAlexa D Despina Hiddenure 09/26/2018, 7:55 PM

## 2018-09-27 LAB — CBC
HCT: 29.8 % — ABNORMAL LOW (ref 39.0–52.0)
Hemoglobin: 9.8 g/dL — ABNORMAL LOW (ref 13.0–17.0)
MCH: 29.3 pg (ref 26.0–34.0)
MCHC: 32.9 g/dL (ref 30.0–36.0)
MCV: 89.2 fL (ref 80.0–100.0)
Platelets: 219 10*3/uL (ref 150–400)
RBC: 3.34 MIL/uL — ABNORMAL LOW (ref 4.22–5.81)
RDW: 13.2 % (ref 11.5–15.5)
WBC: 10.7 10*3/uL — ABNORMAL HIGH (ref 4.0–10.5)
nRBC: 0 % (ref 0.0–0.2)

## 2018-09-27 LAB — BASIC METABOLIC PANEL
Anion gap: 6 (ref 5–15)
BUN: 10 mg/dL (ref 8–23)
CO2: 24 mmol/L (ref 22–32)
Calcium: 7.4 mg/dL — ABNORMAL LOW (ref 8.9–10.3)
Chloride: 102 mmol/L (ref 98–111)
Creatinine, Ser: 0.75 mg/dL (ref 0.61–1.24)
GFR calc Af Amer: >60 mL/min (ref >60)
GFR calc non Af Amer: >60 mL/min (ref >60)
Glucose, Bld: 116 mg/dL — ABNORMAL HIGH (ref 70–99)
Potassium: 3.4 mmol/L — ABNORMAL LOW (ref 3.5–5.1)
Sodium: 132 mmol/L — ABNORMAL LOW (ref 135–145)

## 2018-09-27 MED ORDER — TRAMADOL HCL 50 MG PO TABS
50.0000 mg | ORAL_TABLET | Freq: Four times a day (QID) | ORAL | Status: DC | PRN
  Administered 2018-09-27 – 2018-09-28 (×2): 100 mg via ORAL
  Filled 2018-09-27 (×2): qty 2

## 2018-09-27 MED ORDER — OXYCODONE HCL 5 MG PO TABS: 5.0000 mg | ORAL_TABLET | Freq: Four times a day (QID) | ORAL | 0 refills | Status: DC | PRN

## 2018-09-27 MED ORDER — METHOCARBAMOL 500 MG PO TABS: 500.0000 mg | ORAL_TABLET | Freq: Four times a day (QID) | ORAL | 0 refills | Status: DC | PRN

## 2018-09-27 MED ORDER — ASPIRIN 325 MG PO TBEC: 325.0000 mg | DELAYED_RELEASE_TABLET | Freq: Two times a day (BID) | ORAL | 0 refills | Status: AC

## 2018-09-27 MED ORDER — ATORVASTATIN CALCIUM 40 MG PO TABS
40.0000 mg | ORAL_TABLET | Freq: Every day | ORAL | Status: DC
  Administered 2018-09-27: 40 mg via ORAL
  Filled 2018-09-27: qty 1

## 2018-09-27 NOTE — Progress Notes (Signed)
Physical Therapy Treatment Patient Details Name: Ralph Flores Facer MRN: [id number] DOB: Aug 14, 1946 Today's Date: 09/27/2018    History of Present Illness 72 yo male s/p R TKR on 09/26/18. PMH includes OA, HTN, L knee arthroscopy.     PT Comments    Pt progressing well with mobility and hopeful for dc home tomorrow.   Follow Up Recommendations  Follow surgeon's recommendation for DC plan and follow-up therapies;Supervision for mobility/OOB     Equipment Recommendations  3in1 (PT)    Recommendations for Other Services       Precautions / Restrictions Precautions Precautions: Fall Required Braces or Orthoses: Knee Immobilizer - Right Knee Immobilizer - Right: Other (comment) Restrictions Weight Bearing Restrictions: No Other Position/Activity Restrictions: WBAT     Mobility  Bed Mobility Overal bed mobility: Needs Assistance Bed Mobility: Sit to Supine     Supine to sit: Supervision Sit to supine: Min guard   General bed mobility comments: KI used  Transfers Overall transfer level: Needs assistance Equipment used: Rolling walker (2 wheeled) Transfers: Sit to/from Stand Sit to Stand: Min guard         General transfer comment: for safety and cues for UE/LE placement  Ambulation/Gait Ambulation/Gait assistance: Min guard Gait Distance (Feet): 100 Feet Assistive device: Rolling walker (2 wheeled) Gait Pattern/deviations: Step-to pattern;Decreased stance time - right;Decreased weight shift to right;Antalgic Gait velocity: decr    General Gait Details: min cues for sequence, posture and position from [employer]W   Stairs             Wheelchair Mobility    Modified Rankin (Stroke Patients Only)       Balance Overall balance assessment: Mild deficits observed, not formally tested                                          Cognition Arousal/Alertness: Awake/alert Behavior During Therapy: WFL for tasks assessed/performed Overall  Cognitive Status: Within Functional Limits for tasks assessed                                        Exercises Total Joint Exercises Ankle Circles/Pumps: AROM;Both;20 reps;Supine Quad Sets: AROM;Both;10 reps;Supine Heel Slides: AAROM;Right;15 reps;Supine Straight Leg Raises: AAROM;Right;15 reps    General Comments        Pertinent Vitals/Pain Pain Assessment: 0-10 Pain Score: 5  Pain Location: R knee/thigh Pain Descriptors / Indicators: Aching;Sore Pain Intervention(s): Limited activity within patient's tolerance;Monitored during session;Premedicated before session;Ice applied    Home Living Family/patient expects to be discharged to:: Private residence Living Arrangements: Spouse/significant other Available Help at Discharge: Family;Available PRN/intermittently         Home Equipment: Walker - 2 wheels Additional Comments: will sponge bathe initially    Prior Function Level of Independence: Independent          PT Goals (current goals can now be found in the care plan section) Acute Rehab PT Goals Patient Stated Goal: none stated  PT Goal Formulation: With patient/family Time For Goal Achievement: 10/03/18 Potential to Achieve Goals: Good Progress towards PT goals: Progressing toward goals    Frequency    7X/week      PT Plan Current plan remains appropriate    Co-evaluation              AM-PAC PT "  6 Clicks" Mobility   Outcome Measure  Help needed turning from your back to your side while in a flat bed without using bedrails?: A Little Help needed moving from lying on your back to sitting on the side of a flat bed without using bedrails?: A Little Help needed moving to and from a bed to a chair (including a wheelchair)?: A Little Help needed standing up from a chair using your arms (e.g., wheelchair or bedside chair)?: A Little Help needed to walk in hospital room?: A Little Help needed climbing 3-5 steps with a railing? : A  Little 6 Click Score: 18    End of Session Equipment Utilized During Treatment: Gait belt Activity Tolerance: Patient tolerated treatment well Patient left: in bed;with call bell/phone within reach Nurse Communication: Mobility status;Other (comment) PT Visit Diagnosis: Other abnormalities of gait and mobility (R26.89);Difficulty in walking, not elsewhere classified (R26.2)     Time: 1400-1445 PT Time Calculation (min) (ACUTE ONLY): 45 min  Charges:  $Gait Training: 23-37 mins $Therapeutic Exercise: 8-22 mins                     Mauro KaufmannHunter Aaditya Letizia PT Acute Rehabilitation Services Pager [phone] Office [phone]    Marilu Rylander 09/27/2018, 4:20 PM

## 2018-09-27 NOTE — Progress Notes (Signed)
Subjective: 1 Day Post-Op Procedure(s) (LRB): RIGHT TOTAL KNEE ARTHROPLASTY (Right) Patient reports pain as moderate.  Has been up with therapy and doing well.  Slightly hypotensive, but likely pain med related and anesthesia post-op effects.  Also with acute blood loss anemia from surgery, but also dilutional due to saline bolus.  BP meds being held.  Objective: Vital signs in last 24 hours: Temp:  [97.7 F (36.5 C)-98.9 F (37.2 C)] 98.6 F (37 C) (12/28 1013) Pulse Rate:  [69-87] 86 (12/28 1013) Resp:  [12-20] 16 (12/28 1013) BP: (79-126)/(51-89) 102/89 (12/28 1013) SpO2:  [97 %-100 %] 97 % (12/28 1013)  Intake/Output from previous day: 12/27 0701 - 12/28 0700 In: 5434.1 [P.O.:1200; I.V.:3384.1; IV Piggyback:850] Out: 2875 [Urine:2825; Blood:50] Intake/Output this shift: Total I/O In: 240 [P.O.:240] Out: -   Recent Labs    09/27/18 0353  HGB 9.8*   Recent Labs    09/27/18 0353  WBC 10.7*  RBC 3.34*  HCT 29.8*  PLT 219   Recent Labs    09/27/18 0353  NA 132*  K 3.4*  CL 102  CO2 24  BUN 10  CREATININE 0.75  GLUCOSE 116*  CALCIUM 7.4*   No results for input(s): LABPT, INR in the last 72 hours.  Sensation intact distally Intact pulses distally Dorsiflexion/Plantar flexion intact Incision: dressing C/D/I No cellulitis present Compartment soft   Patient's anticipated LOS is less than 2 midnights, meeting these requirements: - Younger than 2465 - Lives within 1 hour of care - Has a competent adult at home to recover with post-op recover - NO history of  - Chronic pain requiring opiods  - Diabetes  - Coronary Artery Disease  - Heart failure  - Heart attack  - Stroke  - DVT/VTE  - Cardiac arrhythmia  - Respiratory Failure/COPD  - Renal failure  - Anemia  - Advanced Liver disease      Assessment/Plan: 1 Day Post-Op Procedure(s) (LRB): RIGHT TOTAL KNEE ARTHROPLASTY (Right) Up with therapy Plan for discharge tomorrow Discharge home with  home health    Kathryne HitchChristopher Y Carisma Troupe 09/27/2018, 11:08 AM

## 2018-09-27 NOTE — Progress Notes (Signed)
Physical Therapy Treatment Patient Details Name: Ralph Flores MRN: [id number] DOB: Jun 03, 1946 Today's Date: 09/27/2018    History of Present Illness 72 yo male s/p R TKR on 09/26/18. PMH includes OA, HTN, L knee arthroscopy.     PT Comments    Pt motivated and progressing steadily with mobility.  This am, pt ambulated increased distance in halls, initiated therex program and performed LB dressing with assist.   Follow Up Recommendations  Follow surgeon's recommendation for DC plan and follow-up therapies;Supervision for mobility/OOB     Equipment Recommendations  3in1 (PT);Rolling walker with 5" wheels(youth level RW)    Recommendations for Other Services       Precautions / Restrictions Precautions Precautions: Fall Required Braces or Orthoses: Knee Immobilizer - Right Knee Immobilizer - Right: Other (comment) Restrictions Weight Bearing Restrictions: No Other Position/Activity Restrictions: WBAT     Mobility  Bed Mobility Overal bed mobility: Needs Assistance Bed Mobility: Supine to Sit     Supine to sit: Min assist;HOB elevated     General bed mobility comments: cues for sequence with min assist to manage R LE  Transfers Overall transfer level: Needs assistance Equipment used: Rolling walker (2 wheeled) Transfers: Sit to/from Stand Sit to Stand: Min guard         General transfer comment: cues for LE management and use of UEs to self assist.  sit<>stand x 3 to complete tolileting and lower body dressing  Ambulation/Gait Ambulation/Gait assistance: Min guard Gait Distance (Feet): 65 Feet(and additional 50') Assistive device: Rolling walker (2 wheeled) Gait Pattern/deviations: Step-to pattern;Decreased stance time - right;Decreased weight shift to right;Antalgic Gait velocity: decr    General Gait Details: min cues for sequence, posture and position from Rohm and HaasW   Stairs             Wheelchair Mobility    Modified Rankin (Stroke Patients  Only)       Balance Overall balance assessment: Mild deficits observed, not formally tested                                          Cognition Arousal/Alertness: Awake/alert Behavior During Therapy: WFL for tasks assessed/performed Overall Cognitive Status: Within Functional Limits for tasks assessed                                        Exercises Total Joint Exercises Ankle Circles/Pumps: AROM;Both;20 reps;Supine Quad Sets: AROM;Both;10 reps;Supine Heel Slides: AAROM;Right;15 reps;Supine Straight Leg Raises: AAROM;Right;15 reps Goniometric ROM: AAROM at R knee -8 - 45    General Comments        Pertinent Vitals/Pain Pain Assessment: 0-10 Pain Score: 5  Pain Location: R knee  Pain Descriptors / Indicators: Aching Pain Intervention(s): Limited activity within patient's tolerance;Monitored during session;Premedicated before session;Ice applied    Home Living                      Prior Function            PT Goals (current goals can now be found in the care plan section) Acute Rehab PT Goals Patient Stated Goal: none stated  PT Goal Formulation: With patient/family Time For Goal Achievement: 10/03/18 Potential to Achieve Goals: Good Progress towards PT goals: Progressing toward goals    Frequency  7X/week      PT Plan Current plan remains appropriate    Co-evaluation              AM-PAC PT "6 Clicks" Mobility   Outcome Measure  Help needed turning from your back to your side while in a flat bed without using bedrails?: A Little Help needed moving from lying on your back to sitting on the side of a flat bed without using bedrails?: A Little Help needed moving to and from a bed to a chair (including a wheelchair)?: A Little Help needed standing up from a chair using your arms (e.g., wheelchair or bedside chair)?: A Little Help needed to walk in hospital room?: A Little Help needed climbing 3-5 steps  with a railing? : A Little 6 Click Score: 18    End of Session Equipment Utilized During Treatment: Gait belt Activity Tolerance: Patient tolerated treatment well Patient left: in chair;with call bell/phone within reach;with family/visitor present Nurse Communication: Mobility status;Other (comment) PT Visit Diagnosis: Other abnormalities of gait and mobility (R26.89);Difficulty in walking, not elsewhere classified (R26.2)     Time: [id number] PT Time Calculation (min) (ACUTE ONLY): 50 min  Charges:  $Gait Training: 8-22 mins $Therapeutic Exercise: 8-22 mins $Therapeutic Activity: 8-22 mins                     Ralph Flores PT Acute Rehabilitation Services Pager [phone] Office [phone]    Ralph Flores 09/27/2018, 12:13 PM

## 2018-09-27 NOTE — Evaluation (Signed)
Occupational Therapy Evaluation Patient Details Name: Ralph Flores MRN: [id number] DOB: 1946-04-25 Today's Date: 09/27/2018    History of Present Illness 72 yo male s/p R TKR on 09/26/18. PMH includes OA, HTN, L knee arthroscopy.    Clinical Impression   Pt was admitted for the above. All education was completed. Pt is not ready to step over tub at this time (which is upstairs).  HHPT can review when he is able to climb stairs    Follow Up Recommendations  Supervision/Assistance - 24 hour    Equipment Recommendations  3 in 1 bedside commode(if he doesn't have this)    Recommendations for Other Services       Precautions / Restrictions Precautions Precautions: Fall Required Braces or Orthoses: Knee Immobilizer - Right Knee Immobilizer - Right: Other (comment) Restrictions Weight Bearing Restrictions: No Other Position/Activity Restrictions: WBAT       Mobility Bed Mobility by PT Overal bed mobility: Needs Assistance Bed Mobility: Supine to Sit     Supine to sit: Supervision     General bed mobility comments: KI used  Transfers Overall transfer level: Needs assistance Equipment used: Rolling walker (2 wheeled) Transfers: Sit to/from Stand Sit to Stand: Min guard         General transfer comment: for safety and cues for UE/LE placement    Balance Overall balance assessment: Mild deficits observed, not formally tested                                         ADL either performed or assessed with clinical judgement   ADL Overall ADL's : Needs assistance/impaired Eating/Feeding: Independent   Grooming: Wash/dry hands;Supervision/safety;Standing   Upper Body Bathing: Set up;Sitting   Lower Body Bathing: Minimal assistance;Sit to/from stand   Upper Body Dressing : Set up;Sitting   Lower Body Dressing: Minimal assistance;Sit to/from stand Lower Body Dressing Details (indicate cue type and reason): extra effort. Has a reacher at  home:  able to hook pants without, but much easier with.  Wears slip on crocs Toilet Transfer: Min guard;Ambulation;Comfort height toilet;BSC;RW   Toileting- ArchitectClothing Manipulation and Hygiene: Min guard;Sit to/from stand         General ADL Comments: reviewed AE; pt has a Sports administratorreacher at home. Daughter is an Charity fundraiserN who works here; she will go over daily and assist with ted hose     Vision         Perception     Praxis      Pertinent Vitals/Pain Pain Assessment: 0-10 Pain Score: 5  Pain Location: R knee  Pain Descriptors / Indicators: Aching Pain Intervention(s): Limited activity within patient's tolerance;Monitored during session;Premedicated before session;Repositioned     Hand Dominance     Extremity/Trunk Assessment Upper Extremity Assessment Upper Extremity Assessment: Overall WFL for tasks assessed           Communication Communication Communication: No difficulties   Cognition Arousal/Alertness: Awake/alert Behavior During Therapy: WFL for tasks assessed/performed Overall Cognitive Status: Within Functional Limits for tasks assessed                                     General Comments       Exercises Exercises: Total Joint Total Joint Exercises Ankle Circles/Pumps: AROM;Both;20 reps;Supine Quad Sets: AROM;Both;10 reps;Supine Heel Slides: AAROM;Right;15 reps;Supine Straight Leg Raises:  AAROM;Right;15 reps Goniometric ROM: AAROM at R knee -8 - 45   Shoulder Instructions      Home Living Family/patient expects to be discharged to:: Private residence Living Arrangements: Spouse/significant other Available Help at Discharge: Family;Available PRN/intermittently               Bathroom Shower/Tub: Tub/shower unit   Bathroom Toilet: Handicapped height     Home Equipment: Environmental consultantWalker - 2 wheels   Additional Comments: will sponge bathe initially      Prior Functioning/Environment Level of Independence: Independent                  OT Problem List:        OT Treatment/Interventions:      OT Goals(Current goals can be found in the care plan section) Acute Rehab OT Goals Patient Stated Goal: none stated  OT Goal Formulation: All assessment and education complete, DC therapy  OT Frequency:     Barriers to D/C:            Co-evaluation              AM-PAC OT "6 Clicks" Daily Activity     Outcome Measure Help from another person eating meals?: None Help from another person taking care of personal grooming?: A Little Help from another person toileting, which includes using toliet, bedpan, or urinal?: A Little Help from another person bathing (including washing, rinsing, drying)?: A Little Help from another person to put on and taking off regular upper body clothing?: A Little Help from another person to put on and taking off regular lower body clothing?: A Little 6 Click Score: 19   End of Session    Activity Tolerance: Patient tolerated treatment well Patient left: in chair;with call bell/phone within reach;with family/visitor present  OT Visit Diagnosis: Pain Pain - Right/Left: Right Pain - part of body: Knee                Time: [id number] OT Time Calculation (min): 30 min Charges:  OT General Charges $OT Visit: 1 Visit OT Evaluation $OT Eval Low Complexity: 1 Low  Ralph OtterMaryellen Keldric Flores, OTR/L Acute Rehabilitation Services [phone] [facility] pager [phone] office 09/27/2018  Ralph Flores 09/27/2018, 3:32 PM

## 2018-09-27 NOTE — Discharge Instructions (Signed)

## 2018-09-28 MED ORDER — ATORVASTATIN CALCIUM 40 MG PO TABS: 40.0000 mg | ORAL_TABLET | Freq: Every day | ORAL | Status: AC

## 2018-09-28 NOTE — Progress Notes (Signed)
Physical Therapy Treatment Patient Details Name: Ralph Flores MRN: [id number] DOB: 1946/08/25 Today's Date: 09/28/2018    History of Present Illness 72 yo male s/p R TKR on 09/26/18. PMH includes OA, HTN, L knee arthroscopy.     PT Comments    Pt is progressing, reviewed gait, stairs, LE positioning and ice; pt wife and dtr present for session; pt with some mild nausea and dizziness today, VSS except low grade temp of which RN is aware; pt is ready to d/t from PT standpoint; pt positioned in s/l in bed as he wanted to try and nap prior to leaving  Follow Up Recommendations  Follow surgeon's recommendation for DC plan and follow-up therapies;Supervision for mobility/OOB     Equipment Recommendations  3in1 (PT)    Recommendations for Other Services       Precautions / Restrictions Precautions Precautions: Fall Required Braces or Orthoses: Knee Immobilizer - Right Knee Immobilizer - Right: (KI for sleeping) Restrictions Weight Bearing Restrictions: No Other Position/Activity Restrictions: WBAT     Mobility  Bed Mobility Overal bed mobility: Needs Assistance         Sit to supine: Min guard   General bed mobility comments: used gait belt as leg lifter, close supervision to min/guard  Transfers Overall transfer level: Needs assistance Equipment used: Rolling walker (2 wheeled) Transfers: Sit to/from Stand Sit to Stand: Min guard         General transfer comment: for safety and cues for UE/LE placement  Ambulation/Gait Ambulation/Gait assistance: Min guard;Supervision Gait Distance (Feet): 120 Feet Assistive device: Rolling walker (2 wheeled) Gait Pattern/deviations: Step-to pattern;Step-through pattern;Decreased weight shift to right;Decreased stance time - right     General Gait Details: occaisonal cues for sequence; pt with mild dizziness and nausea after amb--VSS   Stairs Stairs: Yes Stairs assistance: Min guard;Min assist Stair Management: No  rails;Backwards;Forwards;With walker Number of Stairs: 1(x3) General stair comments: up down one step forwards and backwards, repeated with daughter    Wheelchair Mobility    Modified Rankin (Stroke Patients Only)       Balance                                            Cognition Arousal/Alertness: Awake/alert Behavior During Therapy: WFL for tasks assessed/performed Overall Cognitive Status: Within Functional Limits for tasks assessed                                        Exercises      General Comments        Pertinent Vitals/Pain Pain Assessment: 0-10 Pain Score: 4  Pain Location: R knee/thigh Pain Descriptors / Indicators: Aching;Sore Pain Intervention(s): Limited activity within patient's tolerance;Monitored during session;Premedicated before session;Repositioned;Ice applied    Home Living                      Prior Function            PT Goals (current goals can now be found in the care plan section) Acute Rehab PT Goals Patient Stated Goal: none stated  PT Goal Formulation: With patient/family Time For Goal Achievement: 10/03/18 Potential to Achieve Goals: Good Progress towards PT goals: Progressing toward goals    Frequency    7X/week  PT Plan Current plan remains appropriate    Co-evaluation              AM-PAC PT "6 Clicks" Mobility   Outcome Measure  Help needed turning from your back to your side while in a flat bed without using bedrails?: A Little Help needed moving from lying on your back to sitting on the side of a flat bed without using bedrails?: A Little Help needed moving to and from a bed to a chair (including a wheelchair)?: A Little Help needed standing up from a chair using your arms (e.g., wheelchair or bedside chair)?: A Little Help needed to walk in hospital room?: A Little Help needed climbing 3-5 steps with a railing? : A Little 6 Click Score: 18    End of  Session Equipment Utilized During Treatment: Gait belt Activity Tolerance: Patient tolerated treatment well Patient left: with call bell/phone within reach;with family/visitor present;in bed   PT Visit Diagnosis: Other abnormalities of gait and mobility (R26.89);Difficulty in walking, not elsewhere classified (R26.2)     Time: 1012-1050 PT Time Calculation (min) (ACUTE ONLY): 38 min  Charges:  $Gait Training: 23-37 mins                     Drucilla Chaletara Chella Chapdelaine, PT  Pager: [phone] Acute Rehab Dept [facility]): [phone]   09/28/2018    [facility] HospitalWILLIAMS,Ralph Raska 09/28/2018, 1:25 PM

## 2018-09-28 NOTE — Care Management Note (Signed)
Case Management Note  Patient Details  Name: Ralph Flores MRN: [id number] Date of Birth: 11-06-1945  Subjective/Objective:  Right TKA                  Action/Plan: NCM spoke to pt and offered choice for HH/CMS list provided and placed on chart. [facility] RW/youth and 3n1 bedside commode [facility]. Dtr will assist pt at home.   Expected Discharge Date:  09/28/18               Expected Discharge Plan:  Home w Home Health Services  In-House Referral:  NA  Discharge planning Services  CM Consult  Post Acute Care Choice:  Home Health Choice offered to:  Patient  DME Arranged:  3-N-1, Walker rolling DME Agency:  [facility]  HH Arranged:  PT HH Agency:  [facility]  Status of Service:  Completed, signed off  If discussed at [employer] Meetings, dates discussed:    Additional Comments:  Elliot CousinShavis, Elise Knobloch Ellen, RN 09/28/2018, 1:23 PM

## 2018-09-28 NOTE — Progress Notes (Signed)
   09/28/18 1500  PT Visit Information  Pt much improved this afternoon, feeling better overall, no dizziness or nausea during mobility   Assistance Needed +1  History of Present Illness 72 yo male s/p R TKR on 09/26/18. PMH includes OA, HTN, L knee arthroscopy.   Subjective Data  Subjective i feel better  Patient Stated Goal none stated   Precautions  Precautions Fall  Precaution Comments KI in place for comfort  Required Braces or Orthoses Knee Immobilizer - Right  Knee Immobilizer - Right  (KI for sleeping)  Restrictions  Weight Bearing Restrictions No  Other Position/Activity Restrictions WBAT   Pain Assessment  Pain Assessment 0-10  Pain Score 2  Pain Location R knee/thigh  Pain Descriptors / Indicators Aching;Sore  Pain Intervention(s) Limited activity within patient's tolerance;Monitored during session;Premedicated before session;Repositioned  Cognition  Arousal/Alertness Awake/alert  Behavior During Therapy WFL for tasks assessed/performed  Overall Cognitive Status Within Functional Limits for tasks assessed  Bed Mobility  General bed mobility comments on EOB  Transfers  Overall transfer level Needs assistance  Equipment used Rolling walker (2 wheeled)  Transfers Sit to/from Stand  Sit to Stand Supervision  General transfer comment for safety and cues for UE/LE placement, pt self corrects   Ambulation/Gait  Ambulation/Gait assistance Supervision;Min guard  Gait Distance (Feet) 80 Feet  Assistive device Rolling walker (2 wheeled)  Gait Pattern/deviations Step-to pattern;Step-through pattern  General Gait Details beginning step through gait; improved wt shift to RLE, more fluid gait pattern  Total Joint Exercises  Ankle Circles/Pumps AROM;5 reps;Both  Quad Sets  (pt and dtr to perform HEP later today)  PT - End of Session  Equipment Utilized During Treatment Gait belt  Activity Tolerance Patient tolerated treatment well  Patient left with call bell/phone within  reach;with family/visitor present;in bed   PT - Assessment/Plan  PT Plan Current plan remains appropriate  PT Visit Diagnosis Other abnormalities of gait and mobility (R26.89);Difficulty in walking, not elsewhere classified (R26.2)  PT Frequency (ACUTE ONLY) 7X/week  Follow Up Recommendations Follow surgeon's recommendation for DC plan and follow-up therapies;Supervision for mobility/OOB  PT equipment 3in1 (PT)  AM-PAC PT "6 Clicks" Mobility Outcome Measure (Version 2)  Help needed turning from your back to your side while in a flat bed without using bedrails? 3  Help needed moving from lying on your back to sitting on the side of a flat bed without using bedrails? 3  Help needed moving to and from a bed to a chair (including a wheelchair)? 3  Help needed standing up from a chair using your arms (e.g., wheelchair or bedside chair)? 3  Help needed to walk in hospital room? 3  Help needed climbing 3-5 steps with a railing?  3  6 Click Score 18  Consider [facility]charge [facility]  PT Goal Progression  Progress towards PT goals Progressing toward goals  Acute Rehab PT Goals  PT Goal Formulation With patient/family  Time For Goal Achievement 10/03/18  Potential to Achieve Goals Good  PT Time Calculation  PT Start Time (ACUTE ONLY) 1455  PT Stop Time (ACUTE ONLY) 1506  PT Time Calculation (min) (ACUTE ONLY) 11 min  PT General Charges  $$ ACUTE PT VISIT 1 Visit  PT Treatments  $Gait Training 8-22 mins

## 2018-09-28 NOTE — Progress Notes (Signed)
   Subjective: 2 Days Post-Op Procedure(s) (LRB): RIGHT TOTAL KNEE ARTHROPLASTY (Right) Patient reports pain as moderate.    Objective: Vital signs in last 24 hours: Temp:  [98.3 F (36.8 C)-99.3 F (37.4 C)] 99.3 F (37.4 C) (12/29 1029) Pulse Rate:  [74-98] 91 (12/29 1029) Resp:  [15-16] 15 (12/29 0455) BP: (119-152)/(58-86) 123/66 (12/29 1029) SpO2:  [97 %-100 %] 100 % (12/29 0455)  Intake/Output from previous day: 12/28 0701 - 12/29 0700 In: 1500.5 [P.O.:960; I.V.:540.5] Out: -  Intake/Output this shift: No intake/output data recorded.  Recent Labs    09/27/18 0353  HGB 9.8*   Recent Labs    09/27/18 0353  WBC 10.7*  RBC 3.34*  HCT 29.8*  PLT 219   Recent Labs    09/27/18 0353  NA 132*  K 3.4*  CL 102  CO2 24  BUN 10  CREATININE 0.75  GLUCOSE 116*  CALCIUM 7.4*   No results for input(s): LABPT, INR in the last 72 hours.  Neurologically intact No results found.  Assessment/Plan: 2 Days Post-Op Procedure(s) (LRB): RIGHT TOTAL KNEE ARTHROPLASTY (Right) Up with therapy this AM, did stairs , discharge home with HHPT   Eldred MangesMark C Gredmarie Delange 09/28/2018, 10:37 AM

## 2018-09-28 NOTE — Progress Notes (Signed)
Discharged from floor via w/c for transport home by car. Belongings & family with pt. No changes in assessment. Ralph Flores  

## 2018-09-29 ENCOUNTER — Encounter (HOSPITAL_COMMUNITY): Payer: Self-pay | Admitting: Orthopaedic Surgery

## 2018-09-30 NOTE — Discharge Summary (Signed)
Patient ID: Ralph Flores MRN: [id number] DOB/AGE: [birth year] 72 y.o.  Admit date: 09/26/2018 Discharge date: 09/30/2018  Admission Diagnoses:  Principal Problem:   Unilateral primary osteoarthritis, right knee Active Problems:   Status post total right knee replacement   Discharge Diagnoses:  Same  Past Medical History:  Diagnosis Date  . Arthritis   . High cholesterol   . HTN (hypertension)     Surgeries: Procedure(s): RIGHT TOTAL KNEE ARTHROPLASTY on 09/26/2018   Consultants:   Discharged Condition: Improved  Hospital Course: Saleh Hilliard ClarkHabib Alcock is an 72 y.o. male who was admitted 09/26/2018 for operative treatment ofUnilateral primary osteoarthritis, right knee. Patient has severe unremitting pain that affects sleep, daily activities, and work/hobbies. After pre-op clearance the patient was taken to the operating room on 09/26/2018 and underwent  Procedure(s): RIGHT TOTAL KNEE ARTHROPLASTY.    Patient was given perioperative antibiotics:  Anti-infectives (From admission, onward)   Start     Dose/Rate Route Frequency Ordered Stop   09/26/18 1700  ceFAZolin (ANCEF) IVPB 1 g/50 mL premix     1 g 100 mL/hr over 30 Minutes Intravenous Every 6 hours 09/26/18 1441 09/26/18 2314   09/26/18 0730  ceFAZolin (ANCEF) IVPB 2g/100 mL premix     2 g 200 mL/hr over 30 Minutes Intravenous On call to O.R. 09/26/18 0726 09/26/18 1050       Patient was given sequential compression devices, early ambulation, and chemoprophylaxis to prevent DVT.  Patient benefited maximally from hospital stay and there were no complications.    Recent vital signs: No data found.   Recent laboratory studies: No results for input(s): WBC, HGB, HCT, PLT, NA, K, CL, CO2, BUN, CREATININE, GLUCOSE, INR, CALCIUM in the last 72 hours.  Invalid input(s): PT, 2   Discharge Medications:   Allergies as of 09/28/2018   No Known Allergies     Medication List    TAKE these medications   amLODipine  2.5 MG tablet Commonly known as:  NORVASC Take 2.5 mg by mouth daily.   aspirin 325 MG EC tablet Take 1 tablet (325 mg total) by mouth 2 (two) times daily after a meal. What changed:    medication strength  how much to take  when to take this   atorvastatin 40 MG tablet Commonly known as:  LIPITOR Take 40 mg by mouth daily. What changed:  Another medication with the same name was added. Make sure you understand how and when to take each.   atorvastatin 40 MG tablet Commonly known as:  LIPITOR Take 1 tablet (40 mg total) by mouth daily at 6 PM. What changed:  You were already taking a medication with the same name, and this prescription was added. Make sure you understand how and when to take each.   CAL-MAG-K CHELA-MAX Caps Take 1 capsule by mouth daily. Calcium 500 mg Magnesium 500 mg Potassium 99 mg   meloxicam 15 MG tablet Commonly known as:  MOBIC Take 15 mg by mouth daily as needed for pain.   methocarbamol 500 MG tablet Commonly known as:  ROBAXIN Take 1 tablet (500 mg total) by mouth every 6 (six) hours as needed for muscle spasms.   MULTIVITAMIN MEN 50+ Tabs Take 1 tablet by mouth daily.   oxyCODONE 5 MG immediate release tablet Commonly known as:  Oxy IR/ROXICODONE Take 1-2 tablets (5-10 mg total) by mouth every 6 (six) hours as needed for moderate pain (pain score 4-6).   PROSTATE HEALTH Caps Take 1 capsule by mouth  daily.   telmisartan-hydrochlorothiazide 80-25 MG tablet Commonly known as:  MICARDIS HCT Take 1 tablet by mouth daily.   TURMERIC PO Take 1,950 mg by mouth daily.   VITAMIN D3 SUPER STRENGTH 50 MCG (2000 UT) Tabs Generic drug:  Cholecalciferol Take 2,000 Units by mouth daily.       Diagnostic Studies: X-ray Knee Right Ap And Lateral  Result Date: 09/26/2018 CLINICAL DATA:  Osteoarthritis of the right knee. Status post total knee replacement. EXAM: RIGHT KNEE - 1-2 VIEW COMPARISON:  None. FINDINGS: The components of the total knee  prosthesis appear in excellent position. Postsurgical air in the soft tissues and joint. No fractures. IMPRESSION: Satisfactory appearance of the right knee after total knee replacement. Electronically Signed   By: Francene BoyersJames  Maxwell M.D.   On: 09/26/2018 13:55    Disposition:     Follow-up Information    [facility] Follow up.   Specialty:  Home Health Services Why:  Home Health Physical Therapy-agency will call to arrange appointment Contact information: [address] [place] [place] [address] [phone]        Kathryne HitchBlackman, Jamin Panther Y, MD Follow up in 2 week(s).   Specialty:  Orthopedic Surgery Contact information: [address] [place] [address] [phone]            Signed: Kathryne HitchChristopher Y Daevon Holdren 09/30/2018, 7:57 AM

## 2018-10-02 ENCOUNTER — Telehealth (INDEPENDENT_AMBULATORY_CARE_PROVIDER_SITE_OTHER): Payer: Self-pay | Admitting: Orthopaedic Surgery

## 2018-10-02 NOTE — Telephone Encounter (Signed)
Verbal order given  

## 2018-10-02 NOTE — Telephone Encounter (Signed)
[facility]  [phone]      Verbal Orders  2 times a week for one week  3 times a week for one week  1 time a week for one week  Range of motion, Strengthen and balance

## 2018-10-09 ENCOUNTER — Ambulatory Visit (INDEPENDENT_AMBULATORY_CARE_PROVIDER_SITE_OTHER): Payer: BLUE CROSS/BLUE SHIELD | Admitting: Orthopaedic Surgery

## 2018-10-09 ENCOUNTER — Encounter (INDEPENDENT_AMBULATORY_CARE_PROVIDER_SITE_OTHER): Payer: Self-pay | Admitting: Orthopaedic Surgery

## 2018-10-09 DIAGNOSIS — Z96651 Presence of right artificial knee joint: Secondary | ICD-10-CM

## 2018-10-09 MED ORDER — METHOCARBAMOL 500 MG PO TABS: 500.0000 mg | ORAL_TABLET | Freq: Four times a day (QID) | ORAL | 0 refills | Status: DC | PRN

## 2018-10-09 MED ORDER — OXYCODONE HCL 5 MG PO TABS: 5.0000 mg | ORAL_TABLET | Freq: Four times a day (QID) | ORAL | 0 refills | Status: AC | PRN

## 2018-10-09 NOTE — Progress Notes (Signed)
The patient is now 2 weeks status post a right total knee arthroplasty.  He is doing very well with home health therapy and is ready for outpatient therapy.  He does need a refill on his pain medicine and muscle relaxant.  He is ambulate with a cane.  On exam his extension is almost full and his flexion is to just past 90 degrees.  His calf is soft.  The right operative knee feels ligamentously stable.  His incisions healed nicely.  I placed new Steri-Strips.  At this point all questions concerns were answered and addressed.  I gave him a prescription for outpatient physical therapy for an outfit is near his house.  I am fine with him using them.  He is released to drive as long as he is not taking a narcotic prior to driving given the fact that he is stronger than most people I see after right knee replacement surgery and his mobility is good.  We will see him back in 4 weeks to see how he is doing overall but no x-rays are needed.

## 2018-11-06 ENCOUNTER — Ambulatory Visit (INDEPENDENT_AMBULATORY_CARE_PROVIDER_SITE_OTHER): Payer: BLUE CROSS/BLUE SHIELD | Admitting: Orthopaedic Surgery

## 2018-11-06 ENCOUNTER — Encounter (INDEPENDENT_AMBULATORY_CARE_PROVIDER_SITE_OTHER): Payer: Self-pay | Admitting: Orthopaedic Surgery

## 2018-11-06 DIAGNOSIS — Z96651 Presence of right artificial knee joint: Secondary | ICD-10-CM

## 2018-11-06 NOTE — Progress Notes (Signed)
The patient has been recovering from right total knee arthroplasty surgery.  He is walking without assistive device and doing great from physical therapy standpoint.  He is 6 weeks out from surgery.  On exam he does have still some mild to moderate swelling in his right knee.  His extension is full and his flexion is full.  His knee feels ligamentously stable.  His pain is minimal.  At this point we will allow him to return to work on 24 February.  He works actually 9 to 10-hour shifts so he wants to only work 8 hours for the first 2 weeks which I think is reasonable.  All question concerns were answered and addressed.  He is having some sciatic symptoms.  We see him back in 6 weeks from now I would like an AP and lateral of his right operative knee and an AP and lateral of his lumbar spine but only if he still having sciatic symptoms.

## 2018-11-17 ENCOUNTER — Telehealth (INDEPENDENT_AMBULATORY_CARE_PROVIDER_SITE_OTHER): Payer: Self-pay | Admitting: Orthopaedic Surgery

## 2018-11-17 NOTE — Telephone Encounter (Signed)
Is this fine??

## 2018-11-17 NOTE — Telephone Encounter (Signed)
Pt came into the office said that Dr.Blackman said he can return to work 11/24/2018 but the pt would like to return back to work on 12/15/2018. He would need a new letter to take to his employer. [phone]

## 2018-11-18 ENCOUNTER — Encounter (INDEPENDENT_AMBULATORY_CARE_PROVIDER_SITE_OTHER): Payer: Self-pay

## 2018-11-18 NOTE — Telephone Encounter (Signed)
That will be fine. 

## 2018-11-18 NOTE — Telephone Encounter (Signed)
Patient aware this is ready at front desk  

## 2018-12-11 ENCOUNTER — Encounter (INDEPENDENT_AMBULATORY_CARE_PROVIDER_SITE_OTHER): Payer: Self-pay | Admitting: Orthopaedic Surgery

## 2018-12-11 ENCOUNTER — Ambulatory Visit (INDEPENDENT_AMBULATORY_CARE_PROVIDER_SITE_OTHER): Payer: BLUE CROSS/BLUE SHIELD

## 2018-12-11 ENCOUNTER — Other Ambulatory Visit: Payer: Self-pay

## 2018-12-11 ENCOUNTER — Ambulatory Visit (INDEPENDENT_AMBULATORY_CARE_PROVIDER_SITE_OTHER): Payer: BLUE CROSS/BLUE SHIELD | Admitting: Orthopaedic Surgery

## 2018-12-11 DIAGNOSIS — Z96651 Presence of right artificial knee joint: Secondary | ICD-10-CM | POA: Diagnosis not present

## 2018-12-11 NOTE — Progress Notes (Signed)
The patient is a very pleasant and active 73 year old gentleman who is now just under 3 months status post a right total knee arthroplasty.  He is doing well overall.  He still works and plans on going back to work Monday.  He does do a job that he is on his feet quite a bit.  He has no issues and is walking with no assistive device.  He does have a little bit of stiffness he states after talking to him.  On examination of his right operative knee his incisions healed nicely.  He does have numbness to the lateral aspect of the incision and I talked him in length in detail about what that is and what to expect long-term.  His knee feels ligamentously stable and his range of motion is full and swelling is minimal.  3 views of the right knee show a well-seated total knee arthroplasty with no complicating features.  This point will continue increase his activities as comfort allows.  We will see him back in 6 months with a final AP and lateral of his right knee.  All question concerns were answered and addressed.

## 2019-01-13 IMAGING — DX DG KNEE 1-2V*R*
2 series · 2 of 2 positions shown · non-contrast
Comparison: None.

CLINICAL DATA: Osteoarthritis of the right knee. Status post total
knee replacement.

EXAM:
RIGHT KNEE - 1-2 VIEW

[knee ap]
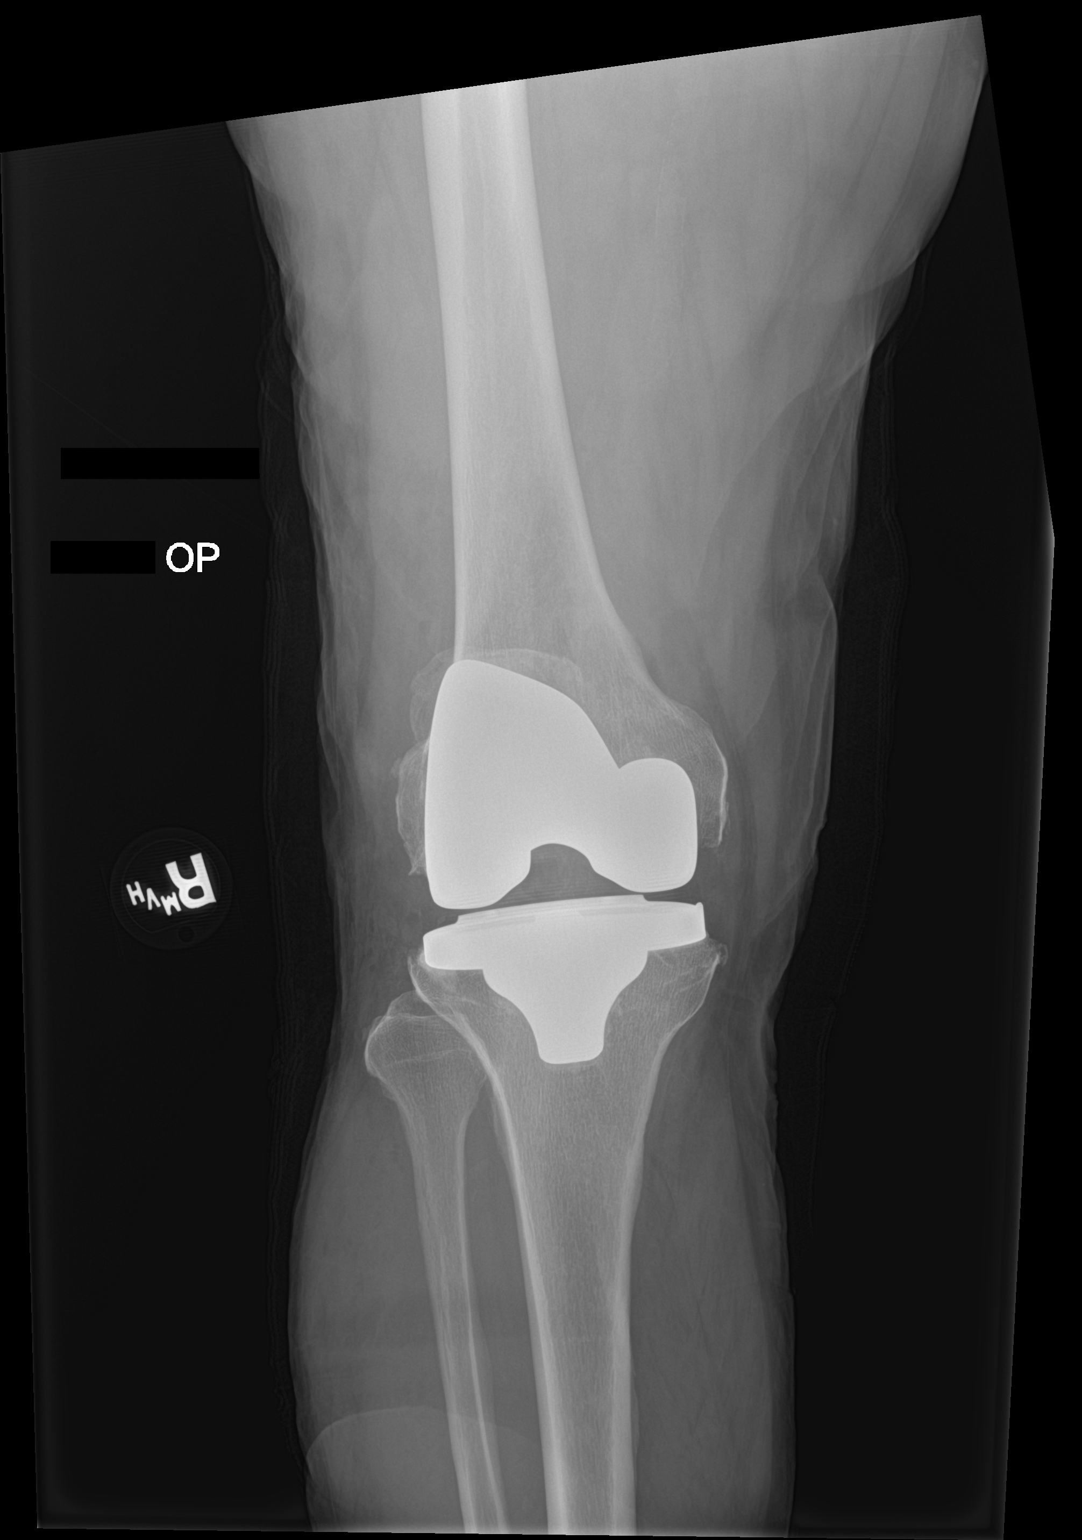

[knee lat]
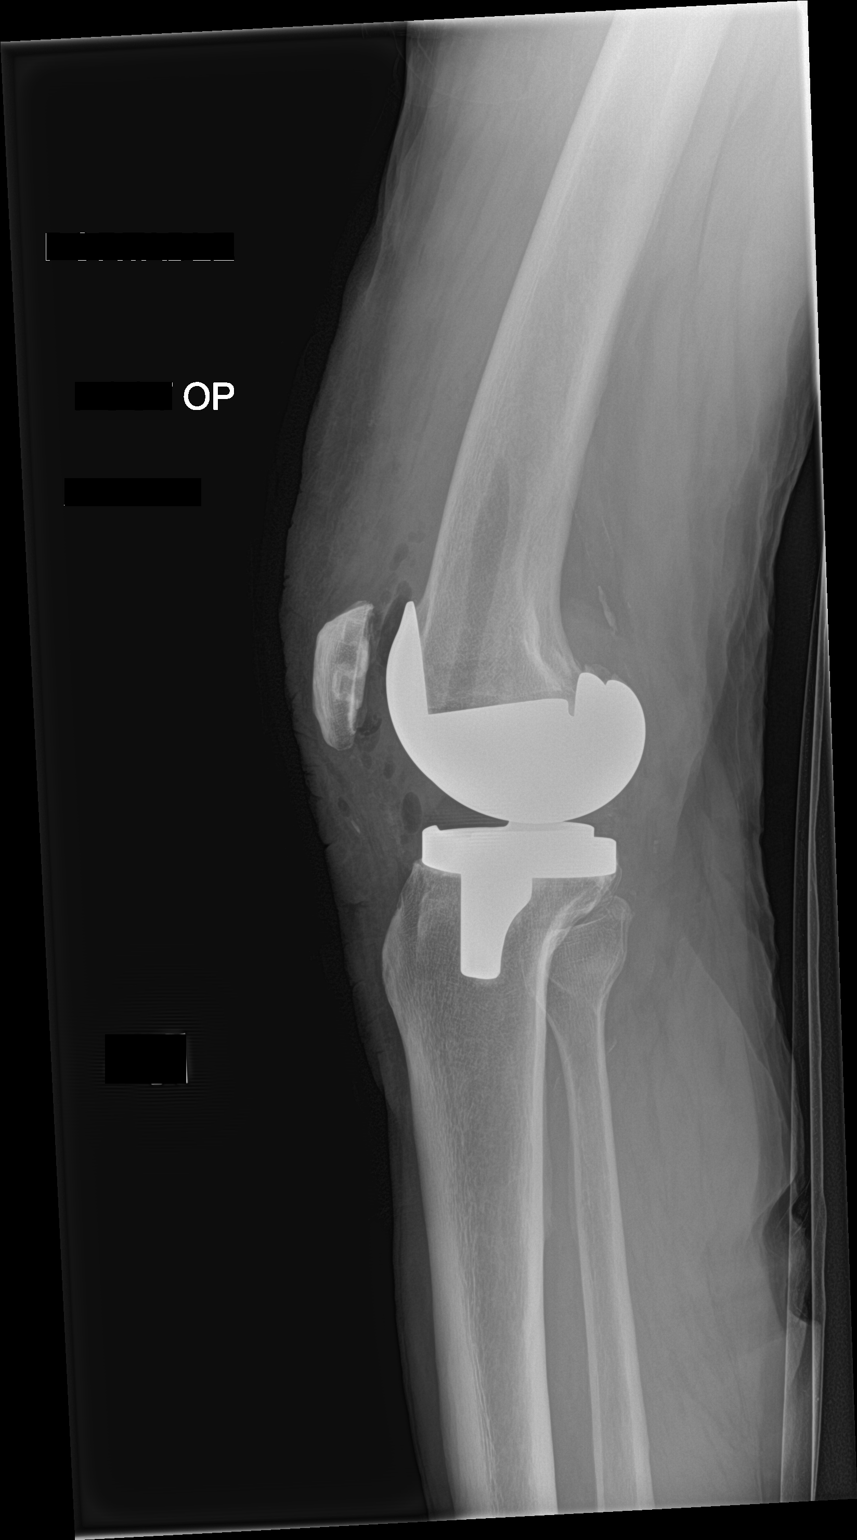

[2 of 2 positions shown; findings below may reference images not displayed]

FINDINGS: The components of the total knee prosthesis appear in excellent
position. Postsurgical air in the soft tissues and joint. No
fractures.
IMPRESSION: Satisfactory appearance of the right knee after total knee
replacement.

## 2019-03-11 ENCOUNTER — Ambulatory Visit: Payer: Self-pay

## 2019-03-11 ENCOUNTER — Ambulatory Visit (INDEPENDENT_AMBULATORY_CARE_PROVIDER_SITE_OTHER): Payer: BC Managed Care – PPO | Admitting: Orthopaedic Surgery

## 2019-03-11 ENCOUNTER — Other Ambulatory Visit: Payer: Self-pay

## 2019-03-11 DIAGNOSIS — M25511 Pain in right shoulder: Secondary | ICD-10-CM | POA: Diagnosis not present

## 2019-03-11 MED ORDER — METHYLPREDNISOLONE ACETATE 40 MG/ML IJ SUSP
40.0000 mg | INTRAMUSCULAR | Status: AC | PRN
  Administered 2019-03-11: 40 mg via INTRA_ARTICULAR

## 2019-03-11 MED ORDER — LIDOCAINE HCL 1 % IJ SOLN
3.0000 mL | INTRAMUSCULAR | Status: AC | PRN
  Administered 2019-03-11: 3 mL

## 2019-03-11 NOTE — Progress Notes (Signed)
Office Visit Note   Patient: Ralph Hilliard ClarkHabib Casseus           Date of Birth: 1945/12/01           MRN: [id number] Visit Date: 03/11/2019              Requested by: Ileana LaddWong, Francis P, MD [address] [place], [place] [address] PCP: Ileana LaddWong, Francis P, MD   Assessment & Plan: Visit Diagnoses:  1. Right shoulder pain, unspecified chronicity     Plan: He does exhibit some signs and symptoms of right shoulder tendinitis with bursitis and impingement.  We offered him a steroid injection in the subacromial outlet and he agreed with this and tolerated it well.  All question concerns were answered addressed.  We will see him back in 3 months for an AP and lateral of his right total knee arthroplasty.  Follow-Up Instructions: Return in about 3 months (around 06/11/2019).   Orders:  Orders Placed This Encounter  Procedures  . Large Joint Inj  . XR Shoulder Right   No orders of the defined types were placed in this encounter.     Procedures: Large Joint Inj: R subacromial bursa on 03/11/2019 4:38 PM Indications: pain and diagnostic evaluation Details: 22 G 1.5 in needle  Arthrogram: No  Medications: 3 mL lidocaine 1 %; 40 mg methylPREDNISolone acetate 40 MG/ML Outcome: tolerated well, no immediate complications Procedure, treatment alternatives, risks and benefits explained, specific risks discussed. Consent was given by the patient. Immediately prior to procedure a time out was called to verify the correct patient, procedure, equipment, support staff and site/side marked as required. Patient was prepped and draped in the usual sterile fashion.       Clinical Data: No additional findings.   Subjective: Chief Complaint  Patient presents with  . Right Shoulder - Pain  The patient is well-known to us.  We performed a total knee arthroplasty on him around 6 months ago.  He is an excellent without knee replacement.  He comes in today though with right shoulder pain.  Is been slowly  worsening with time with overhead activities and reaching behind him and across the front.  He denies any injury.  He does occasionally wake him up at night.  He is not a diabetic.  He has not had a steroid injection in the shoulder before.  He says his total knee is doing very well.  HPI  Review of Systems He currently denies any headache, chest pain, shortness of breath, fever, chills, nausea, vomiting  Objective: Vital Signs: There were no vitals taken for this visit.  Physical Exam He is alert and orient x3 and in no acute distress Ortho Exam Examination of his right shoulder shows excellent range of motion of the shoulder but he definitely has pain in the subacromial outlet and reaching across in front of him.  There is only slight AC joint pain.  His rotator cuff feels strong. Specialty Comments:  No specialty comments available.  Imaging: Xr Shoulder Right  Result Date: 03/11/2019 3 views of the right shoulder shows no acute findings.  The shoulder is well located.    PMFS History: Patient Active Problem List   Diagnosis Date Noted  . Status post total right knee replacement 09/26/2018  . Unilateral primary osteoarthritis, right knee 07/30/2018   Past Medical History:  Diagnosis Date  . Arthritis   . High cholesterol   . HTN (hypertension)     No family history on file.  Past Surgical History:  Procedure Laterality Date  . cataract  Bilateral 2019  . COLONOSCOPY    . KNEE ARTHROSCOPY Left 5 years   done by dr Mayer Camel   . TOTAL KNEE ARTHROPLASTY Right 09/26/2018   Procedure: RIGHT TOTAL KNEE ARTHROPLASTY;  Surgeon: Mcarthur Rossetti, MD;  Location: [facility];  Service: Orthopedics;  Laterality: Right;   Social History   Occupational History  . Not on file  Tobacco Use  . Smoking status: Never Smoker  . Smokeless tobacco: Never Used  Substance and Sexual Activity  . Alcohol use: Never    Frequency: Never  . Drug use: Never  . Sexual activity: Not on file

## 2019-06-11 ENCOUNTER — Encounter: Payer: Self-pay | Admitting: Orthopaedic Surgery

## 2019-06-11 ENCOUNTER — Other Ambulatory Visit: Payer: Self-pay

## 2019-06-11 ENCOUNTER — Ambulatory Visit: Payer: Self-pay

## 2019-06-11 ENCOUNTER — Ambulatory Visit (INDEPENDENT_AMBULATORY_CARE_PROVIDER_SITE_OTHER): Payer: BC Managed Care – PPO | Admitting: Orthopaedic Surgery

## 2019-06-11 DIAGNOSIS — M25562 Pain in left knee: Secondary | ICD-10-CM | POA: Diagnosis not present

## 2019-06-11 DIAGNOSIS — Z96651 Presence of right artificial knee joint: Secondary | ICD-10-CM

## 2019-06-11 DIAGNOSIS — G8929 Other chronic pain: Secondary | ICD-10-CM | POA: Diagnosis not present

## 2019-06-11 MED ORDER — LIDOCAINE HCL 1 % IJ SOLN
3.0000 mL | INTRAMUSCULAR | Status: AC | PRN
  Administered 2019-06-11: 3 mL

## 2019-06-11 MED ORDER — METHYLPREDNISOLONE ACETATE 40 MG/ML IJ SUSP
40.0000 mg | INTRAMUSCULAR | Status: AC | PRN
  Administered 2019-06-11: 40 mg via INTRA_ARTICULAR

## 2019-06-11 NOTE — Progress Notes (Signed)
Office Visit Note   Patient: Ralph Flores           Date of Birth: December 26, 1945           MRN: [id number] Visit Date: 06/11/2019              Requested by: Ileana LaddWong, Ralph P, MD [address] [place] [address] PCP: Ileana LaddWong, Ralph P, MD   Assessment & Plan: Visit Diagnoses:  1. History of total right knee replacement   2. Chronic pain of left knee     Plan: Per his request I did place a steroid injection in his left knee today.  I think his left knee overall looks much better than his right knee did before his right total knee arthroplasty.  He may never need any type of surgery on his left knee at all.  He is doing well overall in terms of his shoulder.  We will hold off on an injection in her shoulder today.  I would like to see him back in 6 months from now to see how he is doing overall but no x-rays are needed.  Follow-Up Instructions: Return in about 6 months (around 12/09/2019).   Orders:  Orders Placed This Encounter  Procedures  . Large Joint Inj  . XR Knee 1-2 Views Right   No orders of the defined types were placed in this encounter.     Procedures: Large Joint Inj: L knee on 06/11/2019 4:09 PM Indications: diagnostic evaluation and pain Details: 22 G 1.5 in needle, superolateral approach  Arthrogram: No  Medications: 3 mL lidocaine 1 %; 40 mg methylPREDNISolone acetate 40 MG/ML Outcome: tolerated well, no immediate complications Procedure, treatment alternatives, risks and benefits explained, specific risks discussed. Consent was given by the patient. Immediately prior to procedure a time out was called to verify the correct patient, procedure, equipment, support staff and site/side marked as required. Patient was prepped and draped in the usual sterile fashion.       Clinical Data: No additional findings.   Subjective: Chief Complaint  Patient presents with  . Right Shoulder - Follow-up  . Right Knee - Follow-up  The patient is now about  10 months status post a right total knee arthroplasty.  His right knee is doing very well for him.  His left knee has been bothering him and he wants us to take a look at it today.  We injected his right shoulder in the subacromial space in June of this year.  He says is much better as well.  He is mobility with his shoulders better he says his right knee is doing great as well.  He would like to consider steroid injection in his left knee.  He is ambulating without assistive device.  He is a very young appearing 73 years old and does work about 11 hours daily on his feet.  HPI  Review of Systems He currently denies any headache, chest pain, shortness of breath, fever, chills, nausea, vomiting  Objective: Vital Signs: There were no vitals taken for this visit.  Physical Exam He is alert and orient x3 and in no acute distress Ortho Exam Examination of his right operative knee shows that he has excellent full range of motion.  There is no instability to the knee.  Examination of his left knee that is painful shows no effusion.  His knee is ligamentously stable left side.  There is only slight varus malalignment but is minimal compared to  what he had on his right knee prior to surgery. Specialty Comments:  No specialty comments available.  Imaging: Xr Knee 1-2 Views Right  Result Date: 06/11/2019 An AP and lateral of the right knee shows a total knee arthroplasty with no complicating features.  Its in good alignment with no evidence of loosening.  The left knee seen on the AP view shows good alignment with well-maintained medial lateral compartments.    PMFS History: Patient Active Problem List   Diagnosis Date Noted  . Status post total right knee replacement 09/26/2018  . Unilateral primary osteoarthritis, right knee 07/30/2018   Past Medical History:  Diagnosis Date  . Arthritis   . High cholesterol   . HTN (hypertension)     History reviewed. No pertinent family history.  Past  Surgical History:  Procedure Laterality Date  . cataract  Bilateral 2019  . COLONOSCOPY    . KNEE ARTHROSCOPY Left 5 years   done by dr Mayer Camel   . TOTAL KNEE ARTHROPLASTY Right 09/26/2018   Procedure: RIGHT TOTAL KNEE ARTHROPLASTY;  Surgeon: Mcarthur Rossetti, MD;  Location: [facility];  Service: Orthopedics;  Laterality: Right;   Social History   Occupational History  . Not on file  Tobacco Use  . Smoking status: Never Smoker  . Smokeless tobacco: Never Used  Substance and Sexual Activity  . Alcohol use: Never    Frequency: Never  . Drug use: Never  . Sexual activity: Not on file

## 2019-06-15 ENCOUNTER — Ambulatory Visit: Payer: Self-pay | Admitting: Orthopaedic Surgery

## 2019-11-15 ENCOUNTER — Ambulatory Visit: Payer: BC Managed Care – PPO | Attending: Internal Medicine

## 2019-11-15 DIAGNOSIS — Z23 Encounter for immunization: Secondary | ICD-10-CM

## 2019-11-15 NOTE — Progress Notes (Signed)
   Covid-19 Vaccination Clinic  Name:  Ralph Flores    MRN: [id number] DOB: 07-Jul-1946  11/15/2019  [name] was observed post Covid-19 immunization for 15 minutes without incidence. He was provided with Vaccine Information Sheet and instruction to access the V-Safe system.   [name] was instructed to call 911 with any severe reactions post vaccine: Marland Kitchen Difficulty breathing  . Swelling of your face and throat  . A fast heartbeat  . A bad rash all over your body  . Dizziness and weakness    Immunizations Administered    Name Date Dose VIS Date Route   Pfizer COVID-19 Vaccine 11/15/2019  9:37 AM 0.3 mL 09/11/2019 Intramuscular   Manufacturer: [employer], [employer]   Lot: [id number]   NDC: [address]-3

## 2019-11-23 ENCOUNTER — Ambulatory Visit: Payer: BC Managed Care – PPO

## 2019-12-08 ENCOUNTER — Ambulatory Visit: Payer: BC Managed Care – PPO | Attending: Internal Medicine

## 2019-12-08 ENCOUNTER — Ambulatory Visit: Payer: BC Managed Care – PPO

## 2019-12-08 DIAGNOSIS — Z23 Encounter for immunization: Secondary | ICD-10-CM | POA: Insufficient documentation

## 2019-12-08 NOTE — Progress Notes (Signed)
   Covid-19 Vaccination Clinic  Name:  Ralph Flores    MRN: [id number] DOB: Aug 14, 1946  12/08/2019  [name] was observed post Covid-19 immunization for 15 minutes without incident. He was provided with Vaccine Information Sheet and instruction to access the V-Safe system.   [name] was instructed to call 911 with any severe reactions post vaccine: Marland Kitchen Difficulty breathing  . Swelling of face and throat  . A fast heartbeat  . A bad rash all over body  . Dizziness and weakness   Immunizations Administered    Name Date Dose VIS Date Route   Pfizer COVID-19 Vaccine 12/08/2019  9:59 AM 0.3 mL 09/11/2019 Intramuscular   Manufacturer: [employer], [employer]   Lot: [id number]   NDC: [address]-7

## 2019-12-09 ENCOUNTER — Ambulatory Visit: Payer: BC Managed Care – PPO | Admitting: Orthopaedic Surgery

## 2019-12-10 ENCOUNTER — Encounter: Payer: Self-pay | Admitting: Orthopaedic Surgery

## 2019-12-10 ENCOUNTER — Ambulatory Visit: Payer: Self-pay

## 2019-12-10 ENCOUNTER — Other Ambulatory Visit: Payer: Self-pay

## 2019-12-10 ENCOUNTER — Ambulatory Visit (INDEPENDENT_AMBULATORY_CARE_PROVIDER_SITE_OTHER): Payer: BC Managed Care – PPO | Admitting: Orthopaedic Surgery

## 2019-12-10 DIAGNOSIS — M25562 Pain in left knee: Secondary | ICD-10-CM | POA: Diagnosis not present

## 2019-12-10 DIAGNOSIS — M542 Cervicalgia: Secondary | ICD-10-CM

## 2019-12-10 DIAGNOSIS — G8929 Other chronic pain: Secondary | ICD-10-CM | POA: Diagnosis not present

## 2019-12-10 MED ORDER — METHOCARBAMOL 500 MG PO TABS: 500.0000 mg | ORAL_TABLET | Freq: Four times a day (QID) | ORAL | 0 refills | Status: DC | PRN

## 2019-12-10 MED ORDER — LIDOCAINE HCL 1 % IJ SOLN
3.0000 mL | INTRAMUSCULAR | Status: AC | PRN
  Administered 2019-12-10: 16:00:00 3 mL

## 2019-12-10 MED ORDER — METHYLPREDNISOLONE ACETATE 40 MG/ML IJ SUSP
40.0000 mg | INTRAMUSCULAR | Status: AC | PRN
  Administered 2019-12-10: 16:00:00 40 mg via INTRA_ARTICULAR

## 2019-12-10 MED ORDER — MELOXICAM 15 MG PO TABS: 15.0000 mg | ORAL_TABLET | Freq: Every day | ORAL | 3 refills | Status: DC | PRN

## 2019-12-10 NOTE — Progress Notes (Signed)
Office Visit Note   Patient: Ralph Flores           Date of Birth: Jul 27, 1946           MRN: [id number] Visit Date: 12/10/2019              Requested by: Ileana Ladd, MD [address] [place],  [place] [address] PCP: Ileana Ladd, MD   Assessment & Plan: Visit Diagnoses:  1. Neck pain   2. Chronic pain of left knee     Plan: I would like to try the combination of a steroid injection of the left knee as well as trying Mobic and Robaxin for inflammatory pain and spasms.  I would also like to send him to outpatient physical therapy to work on his neck and left knee.  He agrees with this treatment plan.  All question concerns were answered addressed.  We will see what it looks like in 4 weeks from now.  No x-rays are needed.  Follow-Up Instructions: Return in about 4 weeks (around 01/07/2020).   Orders:  Orders Placed This Encounter  Procedures  . Large Joint Inj  . XR Cervical Spine 2 or 3 views  . XR Knee 1-2 Views Left   Meds ordered this encounter  Medications  . meloxicam (MOBIC) 15 MG tablet    Sig: Take 1 tablet (15 mg total) by mouth daily as needed for pain.    Dispense:  60 tablet    Refill:  3  . methocarbamol (ROBAXIN) 500 MG tablet    Sig: Take 1 tablet (500 mg total) by mouth every 6 (six) hours as needed for muscle spasms.    Dispense:  40 tablet    Refill:  0      Procedures: Large Joint Inj: L knee on 12/10/2019 3:49 PM Indications: diagnostic evaluation and pain Details: 22 G 1.5 in needle, superolateral approach  Arthrogram: No  Medications: 3 mL lidocaine 1 %; 40 mg methylPREDNISolone acetate 40 MG/ML Outcome: tolerated well, no immediate complications Procedure, treatment alternatives, risks and benefits explained, specific risks discussed. Consent was given by the patient. Immediately prior to procedure a time out was called to verify the correct patient, procedure, equipment, support staff and site/side marked as required. Patient was  prepped and draped in the usual sterile fashion.       Clinical Data: No additional findings.   Subjective: Chief Complaint  Patient presents with  . Left Knee - Follow-up  . Right Knee - Follow-up  . Neck - Pain  Patient is a very pleasant 74 year old gentleman well-known to me.  He has a history of a right total knee arthroplasty that we did in 2019.  We have been following him for some time for his left knee.  He has had a remote arthroscopic intervention done elsewhere of the left knee.  About 6 months ago we did place a steroid ejection left knee.  He has been having left knee pain again.  His right total knee he said is doing well.  The knee is very painful in the left knee and he points the medial joint line.  He did follow that knee about 2 months ago.  He also has a history of chronic neck pain.  He is to pop his neck quite a bit.  He is feels a lot of cracking in the neck and has been getting worse about 3 months now.  He denies any numbness and tingling in  his arms or legs or weakness in his arms.  He is not a diabetic.  HPI  Review of Systems He currently denies any headache, chest pain, shortness of breath, fever, chills, nausea, vomiting  Objective: Vital Signs: There were no vitals taken for this visit.  Physical Exam He is alert and orient x3 and in no acute distress Ortho Exam Examination of his cervical spine shows pain with lateral rotation and bending as well as flexion extension.  He does have intact grip strength bilaterally and intact sensation in his hands.  He has got intact triceps function bilaterally as well.  Examination of his right knee is normal examination of his left knee shows some slight varus malalignment with medial joint line tenderness.  The varus malalignment is correctable.  He has good flexion-extension of that knee. Specialty Comments:  No specialty comments available.  Imaging: XR Knee 1-2 Views Left  Result Date: 12/10/2019 2 views of  the left knee show no acute findings.  There is medial joint space narrowing with slight varus malalignment.  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  There is loss of the normal cervical lordosis.  There is significant spurring throughout the mid cervical spine both anterior and posterior as well as lateral.    PMFS History: Patient Active Problem List   Diagnosis Date Noted  . Status post total right knee replacement 09/26/2018  . Unilateral primary osteoarthritis, right knee 07/30/2018   Past Medical History:  Diagnosis Date  . Arthritis   . High cholesterol   . HTN (hypertension)     History reviewed. No pertinent family history.  Past Surgical History:  Procedure Laterality Date  . cataract  Bilateral 2019  . COLONOSCOPY    . KNEE ARTHROSCOPY Left 5 years   done by dr Mayer Camel   . TOTAL KNEE ARTHROPLASTY Right 09/26/2018   Procedure: RIGHT TOTAL KNEE ARTHROPLASTY;  Surgeon: Mcarthur Rossetti, MD;  Location: [facility];  Service: Orthopedics;  Laterality: Right;   Social History   Occupational History  . Not on file  Tobacco Use  . Smoking status: Never Smoker  . Smokeless tobacco: Never Used  Substance and Sexual Activity  . Alcohol use: Never  . Drug use: Never  . Sexual activity: Not on file

## 2020-01-07 ENCOUNTER — Encounter: Payer: Self-pay | Admitting: Orthopaedic Surgery

## 2020-01-07 ENCOUNTER — Ambulatory Visit (INDEPENDENT_AMBULATORY_CARE_PROVIDER_SITE_OTHER): Payer: BC Managed Care – PPO | Admitting: Orthopaedic Surgery

## 2020-01-07 ENCOUNTER — Other Ambulatory Visit: Payer: Self-pay

## 2020-01-07 DIAGNOSIS — G8929 Other chronic pain: Secondary | ICD-10-CM

## 2020-01-07 DIAGNOSIS — M25562 Pain in left knee: Secondary | ICD-10-CM

## 2020-01-07 DIAGNOSIS — M542 Cervicalgia: Secondary | ICD-10-CM

## 2020-01-07 NOTE — Progress Notes (Signed)
Mr. Marano is following up for his cervical spine and his left knee.  We sent him to physical therapy for his neck given the right-sided neck pain and stiffness.  He still denies any radicular symptoms down either right upper extremity or left upper extremity.  He denies any weakness or numbness and tingling in his hands or feet.  He has a history of a right total knee arthroplasty.  He does have a history of a left knee arthroscopy done years ago.  We did provide a steroid injection in his left knee at his last visit due to knee pain.  He states that is helped significantly.  As of now he is not interested in any other intervention since the knee has calmed down.  On exam he does still have excellent strength in his bilateral upper extremities and lower extremities.  His left knee shows no effusion with excellent range of motion and is ligamentously stable.  He is still quite stiff with some pain to the lateral aspect of his neck to the right.  He will continue at least 2 more weeks of physical therapy for cervical spine.  From my standpoint follow-up can be as needed.  I would not explore a MRI of his left knee unless he has recurrence in his pain.  All question concerns were answered and addressed.

## 2020-05-11 ENCOUNTER — Other Ambulatory Visit: Payer: Self-pay | Admitting: Orthopaedic Surgery

## 2020-09-28 ENCOUNTER — Ambulatory Visit: Payer: BC Managed Care – PPO | Admitting: Orthopaedic Surgery

## 2020-11-09 ENCOUNTER — Ambulatory Visit (INDEPENDENT_AMBULATORY_CARE_PROVIDER_SITE_OTHER): Payer: BC Managed Care – PPO | Admitting: Orthopaedic Surgery

## 2020-11-09 ENCOUNTER — Encounter: Payer: Self-pay | Admitting: Orthopaedic Surgery

## 2020-11-09 VITALS — Ht 62.0 in | Wt 140.0 lb

## 2020-11-09 DIAGNOSIS — M542 Cervicalgia: Secondary | ICD-10-CM | POA: Diagnosis not present

## 2020-11-09 MED ORDER — MELOXICAM 15 MG PO TABS: 15.0000 mg | ORAL_TABLET | Freq: Every day | ORAL | 3 refills | Status: AC | PRN

## 2020-11-09 MED ORDER — METHYLPREDNISOLONE 4 MG PO TABS: ORAL_TABLET | ORAL | 0 refills | Status: AC

## 2020-11-09 MED ORDER — METHOCARBAMOL 500 MG PO TABS
500.0000 mg | ORAL_TABLET | Freq: Four times a day (QID) | ORAL | 1 refills | Status: AC | PRN
Start: 2020-11-09 — End: ?

## 2020-11-09 NOTE — Progress Notes (Signed)
The patient comes in today with neck pain that really wakes him up at night.  He is 75 years old.  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  He still denies any weakness in his arms and denies any numbness and tingling in his hands.  He is 75 years old and well-known to me.  He has good range of motion of the cervical spine and mainly pain in the paraspinal muscles in the midline cervical spine.  He has 5 out of 5 strength of his bilateral upper extremities and normal sensation.  He had excellent grip strength in both his hands as well.  I did look at the x-rays from last year and share these with him from his cervical spine.  He has a loss of his normal cervical lordosis.  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  The most significant is between C6 and C7.  I will try commendation of a steroid taper, meloxicam and Robaxin to see if this will help.  I also recommended he try Voltaren gel for his neck.  He is already been through physical therapy.  He inquires about a seeing a chiropractor at some point and I feel like this is something that can be up to him if he would like to try this.  All questions and concerns were answered and addressed.  Follow-up can be as needed.

## 2021-05-04 ENCOUNTER — Other Ambulatory Visit: Payer: Self-pay | Admitting: Neurosurgery

## 2021-05-04 DIAGNOSIS — M532X1 Spinal instabilities, occipito-atlanto-axial region: Secondary | ICD-10-CM

## 2021-05-05 ENCOUNTER — Other Ambulatory Visit: Payer: Self-pay | Admitting: Neurosurgery

## 2021-05-16 ENCOUNTER — Inpatient Hospital Stay: Admission: RE | Admit: 2021-05-16 | Payer: BC Managed Care – PPO | Source: Ambulatory Visit

## 2021-05-25 ENCOUNTER — Inpatient Hospital Stay: Admit: 2021-05-25 | Payer: BC Managed Care – PPO | Admitting: Neurosurgery

## 2021-05-25 SURGERY — POSTERIOR CERVICAL FUSION/FORAMINOTOMY LEVEL 1
Anesthesia: General

## 2021-05-26 ENCOUNTER — Other Ambulatory Visit (HOSPITAL_COMMUNITY): Payer: BC Managed Care – PPO

## 2021-05-30 ENCOUNTER — Other Ambulatory Visit: Payer: BC Managed Care – PPO

## 2021-06-20 ENCOUNTER — Ambulatory Visit: Payer: BC Managed Care – PPO | Admitting: Orthopedic Surgery

## 2021-09-04 ENCOUNTER — Encounter: Payer: Self-pay | Admitting: Physician Assistant

## 2021-09-04 ENCOUNTER — Ambulatory Visit: Payer: Self-pay

## 2021-09-04 ENCOUNTER — Ambulatory Visit (INDEPENDENT_AMBULATORY_CARE_PROVIDER_SITE_OTHER): Payer: BC Managed Care – PPO | Admitting: Physician Assistant

## 2021-09-04 DIAGNOSIS — M79671 Pain in right foot: Secondary | ICD-10-CM | POA: Diagnosis not present

## 2021-09-04 DIAGNOSIS — G8929 Other chronic pain: Secondary | ICD-10-CM | POA: Diagnosis not present

## 2021-09-04 DIAGNOSIS — M25562 Pain in left knee: Secondary | ICD-10-CM | POA: Diagnosis not present

## 2021-09-04 DIAGNOSIS — R2241 Localized swelling, mass and lump, right lower limb: Secondary | ICD-10-CM | POA: Diagnosis not present

## 2021-09-04 MED ORDER — LIDOCAINE HCL 1 % IJ SOLN
3.0000 mL | INTRAMUSCULAR | Status: AC | PRN
  Administered 2021-09-04: 3 mL

## 2021-09-04 MED ORDER — METHYLPREDNISOLONE ACETATE 40 MG/ML IJ SUSP
40.0000 mg | INTRAMUSCULAR | Status: AC | PRN
  Administered 2021-09-04: 40 mg via INTRA_ARTICULAR

## 2021-09-04 NOTE — Progress Notes (Signed)
Office Visit Note   Patient: Ralph Flores           Date of Birth: [birth year]           MRN: [id number] Visit Date: 09/04/2021              Requested by: Ralph Ladd, MD [address] [place],  [place] [address] PCP: Ralph Ladd, MD   Assessment & Plan: Visit Diagnoses:  1. Chronic pain of left knee   2. Foot mass, right     Plan: Discussed with the patient getting an MRI to evaluate cyst dorsal aspect of his right foot that he defers.  Therefore shown how to lace his shoes to avoid pressure over the cyst.  He will use Voltaren gel over the area.  He will let us know if the mass becomes larger or more painful.  In regards to the left knee he understands to wait at least 3 months between injections.  He will follow-up as needed.  Questions encouraged and answered  Follow-Up Instructions: Return if symptoms worsen or fail to improve.   Orders:  Orders Placed This Encounter  Procedures   Large Joint Inj   XR Knee 1-2 Views Left   XR Foot Complete Right   No orders of the defined types were placed in this encounter.     Procedures: Large Joint Inj: L knee on 09/04/2021 6:05 PM Indications: pain Details: 22 G 1.5 in needle, anterolateral approach  Arthrogram: No  Medications: 3 mL lidocaine 1 %; 40 mg methylPREDNISolone acetate 40 MG/ML Outcome: tolerated well, no immediate complications Procedure, treatment alternatives, risks and benefits explained, specific risks discussed. Consent was given by the patient. Immediately prior to procedure a time out was called to verify the correct patient, procedure, equipment, support staff and site/side marked as required. Patient was prepped and draped in the usual sterile fashion.      Clinical Data: No additional findings.   Subjective: Chief Complaint  Patient presents with   Left Knee - Pain   Right Foot - Pain    HPI Ralph Flores comes in today for left knee and right foot pain.  He is well-known to Dr.  Magnus Flores service with a history of right total knee 09/26/2018.  He has had left knee injections in the past.  Has known osteoarthritis left knee.  He states that he had been doing well until a couple of weeks ago when his knee pain returned.  Last injection in the knee was 01/07/2020.  He is also having right foot pain and reports that he saw physician who gave him injection in his foot couple months ago.  Now he is having pain on the top of his foot.  Has had no known injury to the right foot.  Patient is nondiabetic. Review of Systems Denies any fevers or chills.  Objective: Vital Signs: There were no vitals taken for this visit.  Physical Exam General well-developed well-nourished male no acute distress mood and affect appropriate. Ortho Exam Left knee good range of motion.  Tenderness medial joint line.  No abnormal warmth erythema or effusion. Right foot tenderness over the dorsal hindfoot where there is large mass consistent with ganglion cyst.  Dorsal pedal pulses intact.  There is no abnormal warmth erythema or impending ulcers. Specialty Comments:  No specialty comments available.  Imaging: XR Knee 1-2 Views Left  Result Date: 09/04/2021 Left knee: No acute fracture.  Near bone-on-bone medial compartment.  Mild patellofemoral  changes.  Lateral compartment well-preserved.  Knee is well located.  XR Foot Complete Right  Result Date: 09/04/2021 Right foot 3 views: No acute fractures.  No evidence of bony destruction.  No significant dorsal spurring throughout the mid and hindfoot.    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 No pertinent family history.  Past Surgical History:  Procedure Laterality Date   cataract  Bilateral  2019   COLONOSCOPY     KNEE ARTHROSCOPY Left 5 years   done by dr Ralph Flores    TOTAL KNEE ARTHROPLASTY Right 09/26/2018   Procedure: RIGHT TOTAL KNEE ARTHROPLASTY;  Surgeon: Ralph Hitch, MD;  Location: [facility];  Service: Orthopedics;  Laterality: Right;   Social History   Occupational History   Not on file  Tobacco Use   Smoking status: Never   Smokeless tobacco: Never  Substance and Sexual Activity   Alcohol use: Never   Drug use: Never   Sexual activity: Not on file

## 2021-12-13 DIAGNOSIS — R972 Elevated prostate specific antigen [PSA]: Secondary | ICD-10-CM | POA: Diagnosis not present

## 2021-12-22 DIAGNOSIS — R3912 Poor urinary stream: Secondary | ICD-10-CM | POA: Diagnosis not present

## 2021-12-22 DIAGNOSIS — R972 Elevated prostate specific antigen [PSA]: Secondary | ICD-10-CM | POA: Diagnosis not present

## 2021-12-22 DIAGNOSIS — N401 Enlarged prostate with lower urinary tract symptoms: Secondary | ICD-10-CM | POA: Diagnosis not present

## 2021-12-22 DIAGNOSIS — N5201 Erectile dysfunction due to arterial insufficiency: Secondary | ICD-10-CM | POA: Diagnosis not present

## 2021-12-25 ENCOUNTER — Ambulatory Visit (INDEPENDENT_AMBULATORY_CARE_PROVIDER_SITE_OTHER): Payer: BC Managed Care – PPO | Admitting: Orthopaedic Surgery

## 2021-12-25 DIAGNOSIS — M25562 Pain in left knee: Secondary | ICD-10-CM | POA: Diagnosis not present

## 2021-12-25 DIAGNOSIS — G8929 Other chronic pain: Secondary | ICD-10-CM | POA: Diagnosis not present

## 2021-12-25 MED ORDER — METHYLPREDNISOLONE ACETATE 40 MG/ML IJ SUSP
40.0000 mg | INTRAMUSCULAR | Status: AC | PRN
  Administered 2021-12-25: 40 mg via INTRA_ARTICULAR

## 2021-12-25 MED ORDER — LIDOCAINE HCL 1 % IJ SOLN
3.0000 mL | INTRAMUSCULAR | Status: AC | PRN
Start: 2021-12-25 — End: 2021-12-25
  Administered 2021-12-25: 3 mL

## 2021-12-25 NOTE — Progress Notes (Signed)
? ?Office Visit Note ?  ?Patient: Ralph Flores           ?Date of Birth: 1946-02-21           ?MRN: [id number] ?Visit Date: 12/25/2021 ?             ?Requested by: Ileana Ladd, MD ?[address] ?[place],  [place] [address] ?PCP: Ileana Ladd, MD ? ? ?Assessment & Plan: ?Visit Diagnoses:  ?1. Chronic pain of left knee   ? ? ?Plan: Per his request I did provide a steroid injection in the left knee which he tolerated well.  Follow-up can be as needed.  The next time he does come in for his left knee we should obtain new x-rays of the left knee.  All questions and concerns were answered and addressed. ? ?Follow-Up Instructions: Return if symptoms worsen or fail to improve.  ? ?Orders:  ?Orders Placed This Encounter  ?Procedures  ? Large Joint Inj  ? ?No orders of the defined types were placed in this encounter. ? ? ? ? Procedures: ?Large Joint Inj: L knee on 12/25/2021 1:53 PM ?Indications: diagnostic evaluation and pain ?Details: 22 G 1.5 in needle, superolateral approach ? ?Arthrogram: No ? ?Medications: 3 mL lidocaine 1 %; 40 mg methylPREDNISolone acetate 40 MG/ML ?Outcome: tolerated well, no immediate complications ?Procedure, treatment alternatives, risks and benefits explained, specific risks discussed. Consent was given by the patient. Immediately prior to procedure a time out was called to verify the correct patient, procedure, equipment, support staff and site/side marked as required. Patient was prepped and draped in the usual sterile fashion.  ? ? ? ? ?Clinical Data: ?No additional findings. ? ? ?Subjective: ?Chief Complaint  ?Patient presents with  ? Left Knee - Pain  ?The patient is well-known to [place].  He does have a history of a right knee replacement.  His left knee has been bothering him and he had a steroid injection at left knee in early December of this past year.  He says that did not help as much as previous injections.  His left knee is bothering him and he is requesting a steroid  injection today in the left knee.  He has had no acute changes in medical status or any injuries to that knee.  He is 76 years old.  It hurts mainly with activities and weightbearing.  There is been some stiffness as well.  He is not a diabetic. ? ?HPI ? ?Review of Systems ?There is currently listed no fever, chills, nausea, vomiting ? ?Objective: ?Vital Signs: There were no vitals taken for this visit. ? ?Physical Exam ?He is alert and orient x3 and in no acute distress ?Ortho Exam ?Examination of his left knee shows global tenderness and some medial joint line tenderness.  There is also varus malalignment that is correctable.  His knee is ligament stable with full range of motion. ?Specialty Comments:  ?No specialty comments available. ? ?Imaging: ?No results found. ? ? ?PMFS History: ?Patient Active Problem List  ? Diagnosis Date Noted  ? 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 Unilateral primary osteoarthritis, right knee 07/30/2018  ? ?Past Medical History:  ?Diagnosis Date  ? Arthritis   ? High cholesterol   ? HTN (hypertension)   ?  ?No family history on file.  ?Past Surgical History:  ?Procedure Laterality Date  ? cataract  Bilateral 2019  ? COLONOSCOPY    ? KNEE ARTHROSCOPY Left 5 years  ?  done by dr Turner Daniels   ? TOTAL KNEE ARTHROPLASTY Right 09/26/2018  ? Procedure: RIGHT TOTAL KNEE ARTHROPLASTY;  Surgeon: Kathryne Hitch, MD;  Location: [facility];  Service: Orthopedics;  Laterality: Right;  ? ?Social History  ? ?Occupational History  ? Not on file  ?Tobacco Use  ? Smoking status: Never  ? Smokeless tobacco: Never  ?Substance and Sexual Activity  ? Alcohol use: Never  ? Drug use: Never  ? Sexual activity: Not on file  ? ? ? ? ? ? ?

## 2022-06-12 DIAGNOSIS — M532X1 Spinal instabilities, occipito-atlanto-axial region: Secondary | ICD-10-CM | POA: Diagnosis not present

## 2022-07-03 DIAGNOSIS — E785 Hyperlipidemia, unspecified: Secondary | ICD-10-CM | POA: Diagnosis not present

## 2022-07-03 DIAGNOSIS — I10 Essential (primary) hypertension: Secondary | ICD-10-CM | POA: Diagnosis not present

## 2022-07-03 DIAGNOSIS — N529 Male erectile dysfunction, unspecified: Secondary | ICD-10-CM | POA: Diagnosis not present

## 2022-07-03 DIAGNOSIS — I2510 Atherosclerotic heart disease of native coronary artery without angina pectoris: Secondary | ICD-10-CM | POA: Diagnosis not present

## 2022-07-03 DIAGNOSIS — Z23 Encounter for immunization: Secondary | ICD-10-CM | POA: Diagnosis not present

## 2022-07-31 DIAGNOSIS — M1712 Unilateral primary osteoarthritis, left knee: Secondary | ICD-10-CM | POA: Diagnosis not present

## 2022-09-25 DIAGNOSIS — M1712 Unilateral primary osteoarthritis, left knee: Secondary | ICD-10-CM | POA: Diagnosis not present

## 2022-12-05 DIAGNOSIS — R051 Acute cough: Secondary | ICD-10-CM | POA: Diagnosis not present

## 2022-12-05 DIAGNOSIS — R059 Cough, unspecified: Secondary | ICD-10-CM | POA: Diagnosis not present

## 2022-12-05 DIAGNOSIS — J069 Acute upper respiratory infection, unspecified: Secondary | ICD-10-CM | POA: Diagnosis not present

## 2022-12-05 DIAGNOSIS — I10 Essential (primary) hypertension: Secondary | ICD-10-CM | POA: Diagnosis not present

## 2022-12-07 DIAGNOSIS — R051 Acute cough: Secondary | ICD-10-CM | POA: Diagnosis not present

## 2022-12-07 DIAGNOSIS — N529 Male erectile dysfunction, unspecified: Secondary | ICD-10-CM | POA: Diagnosis not present

## 2022-12-07 DIAGNOSIS — J4521 Mild intermittent asthma with (acute) exacerbation: Secondary | ICD-10-CM | POA: Diagnosis not present

## 2022-12-14 DIAGNOSIS — J208 Acute bronchitis due to other specified organisms: Secondary | ICD-10-CM | POA: Diagnosis not present

## 2022-12-14 DIAGNOSIS — R051 Acute cough: Secondary | ICD-10-CM | POA: Diagnosis not present

## 2023-01-15 DIAGNOSIS — M1712 Unilateral primary osteoarthritis, left knee: Secondary | ICD-10-CM | POA: Diagnosis not present

## 2023-01-16 DIAGNOSIS — M25562 Pain in left knee: Secondary | ICD-10-CM | POA: Diagnosis not present

## 2023-01-16 DIAGNOSIS — Z01818 Encounter for other preprocedural examination: Secondary | ICD-10-CM | POA: Diagnosis not present

## 2023-01-17 DIAGNOSIS — D649 Anemia, unspecified: Secondary | ICD-10-CM | POA: Diagnosis not present

## 2023-01-23 DIAGNOSIS — Z1211 Encounter for screening for malignant neoplasm of colon: Secondary | ICD-10-CM | POA: Diagnosis not present

## 2023-01-23 DIAGNOSIS — Z79899 Other long term (current) drug therapy: Secondary | ICD-10-CM | POA: Diagnosis not present

## 2023-01-23 DIAGNOSIS — E559 Vitamin D deficiency, unspecified: Secondary | ICD-10-CM | POA: Diagnosis not present

## 2023-01-23 DIAGNOSIS — M79609 Pain in unspecified limb: Secondary | ICD-10-CM | POA: Diagnosis not present

## 2023-01-23 DIAGNOSIS — Z01818 Encounter for other preprocedural examination: Secondary | ICD-10-CM | POA: Diagnosis not present

## 2023-01-23 DIAGNOSIS — J9811 Atelectasis: Secondary | ICD-10-CM | POA: Diagnosis not present

## 2023-01-23 DIAGNOSIS — Z1212 Encounter for screening for malignant neoplasm of rectum: Secondary | ICD-10-CM | POA: Diagnosis not present

## 2023-01-31 ENCOUNTER — Encounter: Payer: Self-pay | Admitting: Orthopedic Surgery

## 2023-02-06 DIAGNOSIS — I10 Essential (primary) hypertension: Secondary | ICD-10-CM | POA: Diagnosis not present

## 2023-02-06 DIAGNOSIS — Z79899 Other long term (current) drug therapy: Secondary | ICD-10-CM | POA: Diagnosis not present

## 2023-02-06 DIAGNOSIS — E7800 Pure hypercholesterolemia, unspecified: Secondary | ICD-10-CM | POA: Diagnosis not present

## 2023-02-06 DIAGNOSIS — Z96652 Presence of left artificial knee joint: Secondary | ICD-10-CM | POA: Diagnosis not present

## 2023-02-06 DIAGNOSIS — M1712 Unilateral primary osteoarthritis, left knee: Secondary | ICD-10-CM | POA: Diagnosis not present

## 2023-02-06 DIAGNOSIS — G8918 Other acute postprocedural pain: Secondary | ICD-10-CM | POA: Diagnosis not present

## 2023-02-06 DIAGNOSIS — Z471 Aftercare following joint replacement surgery: Secondary | ICD-10-CM | POA: Diagnosis not present

## 2023-02-06 DIAGNOSIS — Z23 Encounter for immunization: Secondary | ICD-10-CM | POA: Diagnosis not present

## 2023-02-07 DIAGNOSIS — Z79899 Other long term (current) drug therapy: Secondary | ICD-10-CM | POA: Diagnosis not present

## 2023-02-07 DIAGNOSIS — M1712 Unilateral primary osteoarthritis, left knee: Secondary | ICD-10-CM | POA: Diagnosis not present

## 2023-02-07 DIAGNOSIS — Z23 Encounter for immunization: Secondary | ICD-10-CM | POA: Diagnosis not present

## 2023-02-07 DIAGNOSIS — I10 Essential (primary) hypertension: Secondary | ICD-10-CM | POA: Diagnosis not present

## 2023-02-07 DIAGNOSIS — E7800 Pure hypercholesterolemia, unspecified: Secondary | ICD-10-CM | POA: Diagnosis not present

## 2023-02-09 DIAGNOSIS — I10 Essential (primary) hypertension: Secondary | ICD-10-CM | POA: Diagnosis not present

## 2023-02-09 DIAGNOSIS — Z9181 History of falling: Secondary | ICD-10-CM | POA: Diagnosis not present

## 2023-02-09 DIAGNOSIS — Z96652 Presence of left artificial knee joint: Secondary | ICD-10-CM | POA: Diagnosis not present

## 2023-02-09 DIAGNOSIS — Z471 Aftercare following joint replacement surgery: Secondary | ICD-10-CM | POA: Diagnosis not present

## 2023-02-09 DIAGNOSIS — Z7982 Long term (current) use of aspirin: Secondary | ICD-10-CM | POA: Diagnosis not present

## 2023-02-09 DIAGNOSIS — Z96651 Presence of right artificial knee joint: Secondary | ICD-10-CM | POA: Diagnosis not present

## 2023-02-12 DIAGNOSIS — Z7982 Long term (current) use of aspirin: Secondary | ICD-10-CM | POA: Diagnosis not present

## 2023-02-12 DIAGNOSIS — Z471 Aftercare following joint replacement surgery: Secondary | ICD-10-CM | POA: Diagnosis not present

## 2023-02-12 DIAGNOSIS — Z9181 History of falling: Secondary | ICD-10-CM | POA: Diagnosis not present

## 2023-02-12 DIAGNOSIS — Z96652 Presence of left artificial knee joint: Secondary | ICD-10-CM | POA: Diagnosis not present

## 2023-02-12 DIAGNOSIS — I10 Essential (primary) hypertension: Secondary | ICD-10-CM | POA: Diagnosis not present

## 2023-02-12 DIAGNOSIS — Z96651 Presence of right artificial knee joint: Secondary | ICD-10-CM | POA: Diagnosis not present

## 2023-02-14 DIAGNOSIS — Z471 Aftercare following joint replacement surgery: Secondary | ICD-10-CM | POA: Diagnosis not present

## 2023-02-14 DIAGNOSIS — Z9181 History of falling: Secondary | ICD-10-CM | POA: Diagnosis not present

## 2023-02-14 DIAGNOSIS — Z96651 Presence of right artificial knee joint: Secondary | ICD-10-CM | POA: Diagnosis not present

## 2023-02-14 DIAGNOSIS — I10 Essential (primary) hypertension: Secondary | ICD-10-CM | POA: Diagnosis not present

## 2023-02-14 DIAGNOSIS — Z7982 Long term (current) use of aspirin: Secondary | ICD-10-CM | POA: Diagnosis not present

## 2023-02-14 DIAGNOSIS — Z96652 Presence of left artificial knee joint: Secondary | ICD-10-CM | POA: Diagnosis not present

## 2023-02-19 DIAGNOSIS — Z96651 Presence of right artificial knee joint: Secondary | ICD-10-CM | POA: Diagnosis not present

## 2023-02-19 DIAGNOSIS — Z7982 Long term (current) use of aspirin: Secondary | ICD-10-CM | POA: Diagnosis not present

## 2023-02-19 DIAGNOSIS — I10 Essential (primary) hypertension: Secondary | ICD-10-CM | POA: Diagnosis not present

## 2023-02-19 DIAGNOSIS — Z96652 Presence of left artificial knee joint: Secondary | ICD-10-CM | POA: Diagnosis not present

## 2023-02-19 DIAGNOSIS — Z471 Aftercare following joint replacement surgery: Secondary | ICD-10-CM | POA: Diagnosis not present

## 2023-02-19 DIAGNOSIS — Z9181 History of falling: Secondary | ICD-10-CM | POA: Diagnosis not present

## 2023-02-22 DIAGNOSIS — Z96651 Presence of right artificial knee joint: Secondary | ICD-10-CM | POA: Diagnosis not present

## 2023-02-22 DIAGNOSIS — Z96652 Presence of left artificial knee joint: Secondary | ICD-10-CM | POA: Diagnosis not present

## 2023-02-22 DIAGNOSIS — I10 Essential (primary) hypertension: Secondary | ICD-10-CM | POA: Diagnosis not present

## 2023-02-22 DIAGNOSIS — Z9181 History of falling: Secondary | ICD-10-CM | POA: Diagnosis not present

## 2023-02-22 DIAGNOSIS — Z7982 Long term (current) use of aspirin: Secondary | ICD-10-CM | POA: Diagnosis not present

## 2023-02-22 DIAGNOSIS — Z471 Aftercare following joint replacement surgery: Secondary | ICD-10-CM | POA: Diagnosis not present

## 2023-02-26 DIAGNOSIS — Z9181 History of falling: Secondary | ICD-10-CM | POA: Diagnosis not present

## 2023-02-26 DIAGNOSIS — Z471 Aftercare following joint replacement surgery: Secondary | ICD-10-CM | POA: Diagnosis not present

## 2023-02-26 DIAGNOSIS — Z96651 Presence of right artificial knee joint: Secondary | ICD-10-CM | POA: Diagnosis not present

## 2023-02-26 DIAGNOSIS — I10 Essential (primary) hypertension: Secondary | ICD-10-CM | POA: Diagnosis not present

## 2023-02-26 DIAGNOSIS — Z7982 Long term (current) use of aspirin: Secondary | ICD-10-CM | POA: Diagnosis not present

## 2023-02-26 DIAGNOSIS — Z96652 Presence of left artificial knee joint: Secondary | ICD-10-CM | POA: Diagnosis not present

## 2023-02-28 DIAGNOSIS — Z96652 Presence of left artificial knee joint: Secondary | ICD-10-CM | POA: Diagnosis not present

## 2023-02-28 DIAGNOSIS — Z471 Aftercare following joint replacement surgery: Secondary | ICD-10-CM | POA: Diagnosis not present

## 2023-02-28 DIAGNOSIS — Z7982 Long term (current) use of aspirin: Secondary | ICD-10-CM | POA: Diagnosis not present

## 2023-02-28 DIAGNOSIS — Z9181 History of falling: Secondary | ICD-10-CM | POA: Diagnosis not present

## 2023-02-28 DIAGNOSIS — Z96651 Presence of right artificial knee joint: Secondary | ICD-10-CM | POA: Diagnosis not present

## 2023-02-28 DIAGNOSIS — I10 Essential (primary) hypertension: Secondary | ICD-10-CM | POA: Diagnosis not present

## 2023-03-07 DIAGNOSIS — M1712 Unilateral primary osteoarthritis, left knee: Secondary | ICD-10-CM | POA: Diagnosis not present

## 2023-03-11 DIAGNOSIS — M1712 Unilateral primary osteoarthritis, left knee: Secondary | ICD-10-CM | POA: Diagnosis not present

## 2023-03-13 DIAGNOSIS — M1712 Unilateral primary osteoarthritis, left knee: Secondary | ICD-10-CM | POA: Diagnosis not present

## 2023-03-15 DIAGNOSIS — M1712 Unilateral primary osteoarthritis, left knee: Secondary | ICD-10-CM | POA: Diagnosis not present

## 2023-03-18 DIAGNOSIS — M1712 Unilateral primary osteoarthritis, left knee: Secondary | ICD-10-CM | POA: Diagnosis not present

## 2023-03-19 DIAGNOSIS — M1712 Unilateral primary osteoarthritis, left knee: Secondary | ICD-10-CM | POA: Diagnosis not present

## 2023-03-20 DIAGNOSIS — M1712 Unilateral primary osteoarthritis, left knee: Secondary | ICD-10-CM | POA: Diagnosis not present

## 2023-05-07 DIAGNOSIS — M1712 Unilateral primary osteoarthritis, left knee: Secondary | ICD-10-CM | POA: Diagnosis not present

## 2023-07-25 DIAGNOSIS — I10 Essential (primary) hypertension: Secondary | ICD-10-CM | POA: Diagnosis not present

## 2023-07-25 DIAGNOSIS — I2510 Atherosclerotic heart disease of native coronary artery without angina pectoris: Secondary | ICD-10-CM | POA: Diagnosis not present

## 2023-07-25 DIAGNOSIS — E785 Hyperlipidemia, unspecified: Secondary | ICD-10-CM | POA: Diagnosis not present

## 2023-07-25 DIAGNOSIS — Z5181 Encounter for therapeutic drug level monitoring: Secondary | ICD-10-CM | POA: Diagnosis not present

## 2023-07-25 DIAGNOSIS — Z23 Encounter for immunization: Secondary | ICD-10-CM | POA: Diagnosis not present

## 2023-07-25 DIAGNOSIS — E119 Type 2 diabetes mellitus without complications: Secondary | ICD-10-CM | POA: Diagnosis not present

## 2023-07-25 DIAGNOSIS — Z0000 Encounter for general adult medical examination without abnormal findings: Secondary | ICD-10-CM | POA: Diagnosis not present

## 2023-08-01 DIAGNOSIS — M79675 Pain in left toe(s): Secondary | ICD-10-CM | POA: Diagnosis not present

## 2023-08-01 DIAGNOSIS — M2022 Hallux rigidus, left foot: Secondary | ICD-10-CM | POA: Diagnosis not present

## 2024-05-04 ENCOUNTER — Encounter (HOSPITAL_BASED_OUTPATIENT_CLINIC_OR_DEPARTMENT_OTHER): Payer: Self-pay | Admitting: Cardiology

## 2024-05-04 ENCOUNTER — Ambulatory Visit: Attending: Cardiology

## 2024-05-04 ENCOUNTER — Ambulatory Visit (HOSPITAL_BASED_OUTPATIENT_CLINIC_OR_DEPARTMENT_OTHER): Admitting: Cardiology

## 2024-05-04 VITALS — HR 90 | Ht 62.0 in | Wt 147.8 lb

## 2024-05-04 DIAGNOSIS — R55 Syncope and collapse: Secondary | ICD-10-CM

## 2024-05-04 DIAGNOSIS — R072 Precordial pain: Secondary | ICD-10-CM

## 2024-05-04 DIAGNOSIS — R42 Dizziness and giddiness: Secondary | ICD-10-CM | POA: Diagnosis not present

## 2024-05-04 NOTE — Progress Notes (Unsigned)
 Enrolled for [employer] to mail a ZIO XT long term holter monitor to the patients address on file.

## 2024-05-04 NOTE — Patient Instructions (Addendum)
 Medication Instructions:  The current medical regimen is effective;  continue present plan and medications.  *If you need a refill on your cardiac medications before your next appointment, please call your pharmacy*  Testing/Procedures: Your physician has requested that you have an echocardiogram. Echocardiography is a painless test that uses sound waves to create images of your heart. It provides your doctor with information about the size and shape of your heart and how well your heart's chambers and valves are working. This procedure takes approximately one hour. There are no restrictions for this procedure. Please do NOT wear cologne, perfume, aftershave, or lotions (deodorant is allowed). Please arrive 15 minutes prior to your appointment time.  Please note: We ask at that you not bring children with you during ultrasound (echo/ vascular) testing. Due to room size and safety concerns, children are not allowed in the ultrasound rooms during exams. Our front office staff cannot provide observation of children in our lobby area while testing is being conducted. An adult accompanying a patient to their appointment will only be allowed in the ultrasound room at the discretion of the ultrasound technician under special circumstances. We apologize for any inconvenience.  You are scheduled for a Myocardial Perfusion Imaging Study on    at     .   Please arrive 15 minutes prior to your appointment time for registration and insurance purposes.   The test will take approximately 3 to 4 hours to complete; you may bring reading material. If someone comes with you to your appointment, they will need to remain in the main lobby due to limited space in the testing area.   If you are pregnant or breastfeeding, please notify the nuclear lab prior to your appointment.   How to prepare for your Myocardial Perfusion test:   Do not eat or drink 3 hours prior to your test, except you may have water .    Do not  consume products containing caffeine (regular or decaffeinated) 12 hours prior to your test (ex: coffee, chocolate, soda, tea)   Do bring a list of your current medications with you. If not listed below, you may take your medications as normal.   Bring any held medication to your appointment, as you may be required to take it once the test is complete.   Do wear comfortable clothes (no dresses or overalls) and walking shoes. Tennis shoes are preferred. No heels or open toed shoes.  Do not wear cologne, perfume, aftershave or lotions (deodorant is allowed).   If these instructions are not followed, you test will have to be rescheduled.   Please report to [facility]. If you have questions or concerns about your appointment, please call the Nuclear Lab at #[phone].  If you cannot keep your appointment, please provide 24 hour notification to the Nuclear lab to avoid a possible $50 charge to your account.     ZIO XT- Long Term Monitor Instructions  Your physician has requested you wear a ZIO patch monitor for 14 days.  This is a single patch monitor. [employer] supplies one patch monitor per enrollment. Additional stickers are not available. Please do not apply patch if you will be having a Nuclear Stress Test,  Echocardiogram, Cardiac CT, MRI, or Chest Xray during the period you would be wearing the  monitor. The patch cannot be worn during these tests. You cannot remove and re-apply the  ZIO XT patch monitor.  Your ZIO patch monitor will be mailed 3 day USPS to your  address on file. It may take 3-5 days  to receive your monitor after you have been enrolled.  Once you have received your monitor, please review the enclosed instructions. Your monitor  has already been registered assigning a specific monitor serial # to you.  Billing and Patient Assistance Program Information  We have supplied [employer] with any of your insurance information on file for billing purposes. [employer]  offers a sliding scale Patient Assistance Program for patients that do not have  insurance, or whose insurance does not completely cover the cost of the ZIO monitor.  You must apply for the Patient Assistance Program to qualify for this discounted rate.  To apply, please call [employer] at [phone], select option 4, select option 2, ask to apply for  Patient Assistance Program. Meredeth will ask your household income, and how many people  are in your household. They will quote your out-of-pocket cost based on that information.  [employer] will also be able to set up a 30-month, interest-free payment plan if needed.  Applying the monitor   Shave hair from upper left chest.  Hold abrader disc by orange tab. Rub abrader in 40 strokes over the upper left chest as  indicated in your monitor instructions.  Clean area with 4 enclosed alcohol pads. Let dry.  Apply patch as indicated in monitor instructions. Patch will be placed under collarbone on left  side of chest with arrow pointing upward.  Rub patch adhesive wings for 2 minutes. Remove white label marked 1. Remove the white  label marked 2. Rub patch adhesive wings for 2 additional minutes.  While looking in a mirror, press and release button in center of patch. A small green light will  flash 3-4 times. This will be your only indicator that the monitor has been turned on.  Do not shower for the first 24 hours. You may shower after the first 24 hours.  Press the button if you feel a symptom. You will hear a small click. Record Date, Time and  Symptom in the Patient Logbook.  When you are ready to remove the patch, follow instructions on the last 2 pages of Patient  Logbook. Stick patch monitor onto the last page of Patient Logbook.  Place Patient Logbook in the blue and white box. Use locking tab on box and tape box closed  securely. The blue and white box has prepaid postage on it. Please place it in the mailbox as  soon as possible. Your  physician should have your test results approximately 7 days after the  monitor has been mailed back to [facility].  Call [facility] Customer Care at [phone] if you have questions regarding  your ZIO XT patch monitor. Call them immediately if you see an orange light blinking on your  monitor.  If your monitor falls off in less than 4 days, contact our Monitor department at [phone].  If your monitor becomes loose or falls off after 4 days call [employer] at [phone] for  suggestions on securing your monitor   Follow-Up: At [facility], you and your health needs are our priority.  As part of our continuing mission to provide you with exceptional heart care, our providers are all part of one team.  This team includes your primary Cardiologist (physician) and Advanced Practice Providers or APPs (Physician Assistants and Nurse Practitioners) who all work together to provide you with the care you need, when you need it.  Your next appointment:   Follow up as needed  based on the results of the above testing.   We recommend signing up for the patient portal called MyChart.  Sign up information is provided on this After Visit Summary.  MyChart is used to connect with patients for Virtual Visits (Telemedicine).  Patients are able to view lab/test results, encounter notes, upcoming appointments, etc.  Non-urgent messages can be sent to your provider as well.   To learn more about what you can do with MyChart, go to [email or web address].
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
## 2024-05-06 NOTE — Addendum Note (Signed)
 Addended by: THEOTIS SHARLET PARAS on: 05/06/2024 01:35 PM   Modules accepted: Orders

## 2024-05-08 ENCOUNTER — Encounter (HOSPITAL_COMMUNITY): Payer: Self-pay | Admitting: *Deleted

## 2024-05-14 ENCOUNTER — Other Ambulatory Visit (HOSPITAL_BASED_OUTPATIENT_CLINIC_OR_DEPARTMENT_OTHER): Payer: Self-pay | Admitting: Cardiology

## 2024-05-14 DIAGNOSIS — R55 Syncope and collapse: Secondary | ICD-10-CM

## 2024-05-14 DIAGNOSIS — R072 Precordial pain: Secondary | ICD-10-CM

## 2024-05-18 ENCOUNTER — Ambulatory Visit (HOSPITAL_COMMUNITY)
Admission: RE | Admit: 2024-05-18 | Discharge: 2024-05-18 | Disposition: A | Discharge status: Home / Self Care | Source: Ambulatory Visit | Attending: Cardiology | Admitting: Cardiology

## 2024-05-18 DIAGNOSIS — R55 Syncope and collapse: Secondary | ICD-10-CM | POA: Insufficient documentation

## 2024-05-18 DIAGNOSIS — R072 Precordial pain: Secondary | ICD-10-CM | POA: Diagnosis not present

## 2024-05-18 LAB — MYOCARDIAL PERFUSION IMAGING
LV dias vol: 59 mL (ref 62–150)
LV sys vol: 14 mL (ref 4.2–5.8)
Nuc Stress EF: 76 %
Peak HR: 99 {beats}/min
Rest HR: 69 {beats}/min
Rest Nuclear Isotope Dose: 10.2 mCi
SDS: 0
SRS: 0
SSS: 0
ST Depression (mm): 0 mm
Stress Nuclear Isotope Dose: 32.7 mCi
TID: 0.97

## 2024-05-18 MED ORDER — REGADENOSON 0.4 MG/5ML IV SOLN
INTRAVENOUS | Status: AC
  Filled 2024-05-18: qty 5

## 2024-05-18 MED ORDER — REGADENOSON 0.4 MG/5ML IV SOLN
0.4000 mg | Freq: Once | INTRAVENOUS | Status: AC
  Administered 2024-05-18: 0.4 mg via INTRAVENOUS

## 2024-05-18 MED ORDER — TECHNETIUM TC 99M TETROFOSMIN IV KIT
10.2000 | PACK | Freq: Once | INTRAVENOUS | Status: AC | PRN
  Administered 2024-05-18: 10.2 via INTRAVENOUS

## 2024-05-18 MED ORDER — TECHNETIUM TC 99M TETROFOSMIN IV KIT
32.7000 | PACK | Freq: Once | INTRAVENOUS | Status: AC | PRN
  Administered 2024-05-18: 32.7 via INTRAVENOUS

## 2024-05-19 ENCOUNTER — Ambulatory Visit: Payer: Self-pay | Admitting: Cardiology

## 2024-05-22 ENCOUNTER — Ambulatory Visit: Payer: Self-pay | Admitting: Cardiology

## 2024-05-29 ENCOUNTER — Telehealth: Payer: Self-pay | Admitting: Cardiology

## 2024-05-29 NOTE — Telephone Encounter (Signed)
 Overall low risk stress test.  Normal pump function.  Coronary artery calcification and aortic atherosclerosis noted.  Continue risk factor modification which includes atorvastatin ,  aspirin , blood pressure control   Reviewed results with pt who states understanding.  He will continue to monitor his BP and bring diary to [facility] on 06/03/24.

## 2024-05-29 NOTE — Telephone Encounter (Signed)
Patient returning Pam's call in regards to stress test results.

## 2024-06-01 DIAGNOSIS — R42 Dizziness and giddiness: Secondary | ICD-10-CM | POA: Diagnosis not present

## 2024-06-01 DIAGNOSIS — R55 Syncope and collapse: Secondary | ICD-10-CM | POA: Diagnosis not present

## 2024-06-03 ENCOUNTER — Ambulatory Visit (HOSPITAL_BASED_OUTPATIENT_CLINIC_OR_DEPARTMENT_OTHER)

## 2024-06-03 DIAGNOSIS — R072 Precordial pain: Secondary | ICD-10-CM

## 2024-06-03 DIAGNOSIS — R55 Syncope and collapse: Secondary | ICD-10-CM | POA: Diagnosis not present

## 2024-06-03 LAB — ECHOCARDIOGRAM COMPLETE
Area-P 1/2: 4.06 cm2
P 1/2 time: 485 ms
S' Lateral: 1.68 cm
